# Patient Record
Sex: Male | Born: 1955 | Race: White | Hispanic: No
Health system: Midwestern US, Community
[De-identification: ages and names within clinical notes are randomized; demographics above are authoritative.]

---

## 2013-01-08 DEATH — deceased

## 2018-12-10 NOTE — Progress Notes (Signed)
Jason Whitehead was on our recall list for a repeat colonoscopy    Per [facility] patient has been deceased since 01/02/2013.    Recall cancelled.

## 2018-12-10 NOTE — Progress Notes (Signed)
Jason Whitehead was on our recall list for a repeat colonoscopy    Per [facility] patient has been deceased since 01-04-13.    Recall cancelled.

## 2020-08-18 ENCOUNTER — Encounter (HOSPITAL_COMMUNITY): Payer: Self-pay | Admitting: Emergency Medicine

## 2020-08-18 ENCOUNTER — Emergency Department (HOSPITAL_COMMUNITY): Payer: BC Managed Care – PPO

## 2020-08-18 ENCOUNTER — Other Ambulatory Visit: Payer: Self-pay

## 2020-08-18 ENCOUNTER — Inpatient Hospital Stay (HOSPITAL_COMMUNITY)
Admission: EM | Admit: 2020-08-18 | Discharge: 2020-08-30 | DRG: 233 | Disposition: A | Discharge status: Home / Self Care | Payer: BC Managed Care – PPO | Attending: Cardiothoracic Surgery | Admitting: Cardiothoracic Surgery

## 2020-08-18 DIAGNOSIS — E785 Hyperlipidemia, unspecified: Secondary | ICD-10-CM | POA: Diagnosis present

## 2020-08-18 DIAGNOSIS — Z951 Presence of aortocoronary bypass graft: Secondary | ICD-10-CM | POA: Diagnosis not present

## 2020-08-18 DIAGNOSIS — I2510 Atherosclerotic heart disease of native coronary artery without angina pectoris: Secondary | ICD-10-CM | POA: Diagnosis present

## 2020-08-18 DIAGNOSIS — Z9889 Other specified postprocedural states: Secondary | ICD-10-CM

## 2020-08-18 DIAGNOSIS — F17210 Nicotine dependence, cigarettes, uncomplicated: Secondary | ICD-10-CM | POA: Diagnosis present

## 2020-08-18 DIAGNOSIS — Z20822 Contact with and (suspected) exposure to covid-19: Secondary | ICD-10-CM | POA: Diagnosis present

## 2020-08-18 DIAGNOSIS — I5041 Acute combined systolic (congestive) and diastolic (congestive) heart failure: Secondary | ICD-10-CM | POA: Diagnosis present

## 2020-08-18 DIAGNOSIS — I2109 ST elevation (STEMI) myocardial infarction involving other coronary artery of anterior wall: Secondary | ICD-10-CM | POA: Diagnosis not present

## 2020-08-18 DIAGNOSIS — D72829 Elevated white blood cell count, unspecified: Secondary | ICD-10-CM | POA: Diagnosis present

## 2020-08-18 DIAGNOSIS — R0789 Other chest pain: Secondary | ICD-10-CM | POA: Diagnosis present

## 2020-08-18 DIAGNOSIS — Z720 Tobacco use: Secondary | ICD-10-CM

## 2020-08-18 DIAGNOSIS — I480 Paroxysmal atrial fibrillation: Secondary | ICD-10-CM | POA: Diagnosis not present

## 2020-08-18 DIAGNOSIS — D62 Acute posthemorrhagic anemia: Secondary | ICD-10-CM | POA: Diagnosis not present

## 2020-08-18 DIAGNOSIS — F17200 Nicotine dependence, unspecified, uncomplicated: Secondary | ICD-10-CM | POA: Diagnosis present

## 2020-08-18 DIAGNOSIS — Z79899 Other long term (current) drug therapy: Secondary | ICD-10-CM

## 2020-08-18 DIAGNOSIS — R7401 Elevation of levels of liver transaminase levels: Secondary | ICD-10-CM | POA: Diagnosis present

## 2020-08-18 DIAGNOSIS — N289 Disorder of kidney and ureter, unspecified: Secondary | ICD-10-CM | POA: Diagnosis not present

## 2020-08-18 DIAGNOSIS — I214 Non-ST elevation (NSTEMI) myocardial infarction: Principal | ICD-10-CM

## 2020-08-18 DIAGNOSIS — E7800 Pure hypercholesterolemia, unspecified: Secondary | ICD-10-CM | POA: Diagnosis not present

## 2020-08-18 DIAGNOSIS — Z01810 Encounter for preprocedural cardiovascular examination: Secondary | ICD-10-CM | POA: Diagnosis not present

## 2020-08-18 DIAGNOSIS — I25110 Atherosclerotic heart disease of native coronary artery with unstable angina pectoris: Secondary | ICD-10-CM | POA: Diagnosis not present

## 2020-08-18 DIAGNOSIS — Z09 Encounter for follow-up examination after completed treatment for conditions other than malignant neoplasm: Secondary | ICD-10-CM

## 2020-08-18 LAB — CBC WITH DIFFERENTIAL/PLATELET
Abs Immature Granulocytes: 0.07 10*3/uL (ref 0.00–0.07)
Basophils Absolute: 0 10*3/uL (ref 0.0–0.1)
Basophils Relative: 0 %
Eosinophils Absolute: 0 10*3/uL (ref 0.0–0.5)
Eosinophils Relative: 0 %
HCT: 48.2 % (ref 39.0–52.0)
Hemoglobin: 15.9 g/dL (ref 13.0–17.0)
Immature Granulocytes: 1 %
Lymphocytes Relative: 7 %
Lymphs Abs: 1.1 10*3/uL (ref 0.7–4.0)
MCH: 30.9 pg (ref 26.0–34.0)
MCHC: 33 g/dL (ref 30.0–36.0)
MCV: 93.6 fL (ref 80.0–100.0)
Monocytes Absolute: 1 10*3/uL (ref 0.1–1.0)
Monocytes Relative: 7 %
Neutro Abs: 13 10*3/uL — ABNORMAL HIGH (ref 1.7–7.7)
Neutrophils Relative %: 85 %
Platelets: 273 10*3/uL (ref 150–400)
RBC: 5.15 MIL/uL (ref 4.22–5.81)
RDW: 13.5 % (ref 11.5–15.5)
WBC: 15.3 10*3/uL — ABNORMAL HIGH (ref 4.0–10.5)
nRBC: 0 % (ref 0.0–0.2)

## 2020-08-18 LAB — RESP PANEL BY RT-PCR (FLU A&B, COVID) ARPGX2
Influenza A by PCR: NEGATIVE
Influenza B by PCR: NEGATIVE
SARS Coronavirus 2 by RT PCR: NEGATIVE

## 2020-08-18 LAB — TROPONIN I (HIGH SENSITIVITY)
Troponin I (High Sensitivity): >27000 ng/L (ref <18)
Troponin I (High Sensitivity): >27000 ng/L (ref <18)

## 2020-08-18 LAB — COMPREHENSIVE METABOLIC PANEL
ALT: 59 U/L — ABNORMAL HIGH (ref 0–44)
AST: 261 U/L — ABNORMAL HIGH (ref 15–41)
Albumin: 4.7 g/dL (ref 3.5–5.0)
Alkaline Phosphatase: 81 U/L (ref 38–126)
Anion gap: 9 (ref 5–15)
BUN: 21 mg/dL (ref 8–23)
CO2: 26 mmol/L (ref 22–32)
Calcium: 10.3 mg/dL (ref 8.9–10.3)
Chloride: 105 mmol/L (ref 98–111)
Creatinine, Ser: 1.15 mg/dL (ref 0.61–1.24)
GFR, Estimated: >60 mL/min (ref >60)
Glucose, Bld: 120 mg/dL — ABNORMAL HIGH (ref 70–99)
Potassium: 4.3 mmol/L (ref 3.5–5.1)
Sodium: 140 mmol/L (ref 135–145)
Total Bilirubin: 0.2 mg/dL — ABNORMAL LOW (ref 0.3–1.2)
Total Protein: 8.1 g/dL (ref 6.5–8.1)

## 2020-08-18 LAB — HEMOGLOBIN A1C
Hgb A1c MFr Bld: 5.7 % — ABNORMAL HIGH (ref 4.8–5.6)
Mean Plasma Glucose: 116.89 mg/dL

## 2020-08-18 LAB — CBG MONITORING, ED: Glucose-Capillary: 124 mg/dL — ABNORMAL HIGH (ref 70–99)

## 2020-08-18 LAB — URINALYSIS, ROUTINE W REFLEX MICROSCOPIC
Bacteria, UA: NONE SEEN
Bilirubin Urine: NEGATIVE
Glucose, UA: NEGATIVE mg/dL
Ketones, ur: NEGATIVE mg/dL
Leukocytes,Ua: NEGATIVE
Nitrite: NEGATIVE
Protein, ur: NEGATIVE mg/dL
Specific Gravity, Urine: 1.01 (ref 1.005–1.030)
pH: 5 (ref 5.0–8.0)

## 2020-08-18 LAB — LIPASE, BLOOD: Lipase: 49 U/L (ref 11–51)

## 2020-08-18 LAB — RAPID URINE DRUG SCREEN, HOSP PERFORMED
Amphetamines: NOT DETECTED
Barbiturates: NOT DETECTED
Benzodiazepines: NOT DETECTED
Cocaine: NOT DETECTED
Opiates: NOT DETECTED
Tetrahydrocannabinol: NOT DETECTED

## 2020-08-18 LAB — PROTIME-INR
INR: 0.9 (ref 0.8–1.2)
Prothrombin Time: 12.3 seconds (ref 11.4–15.2)

## 2020-08-18 LAB — APTT: aPTT: 33 seconds (ref 24–36)

## 2020-08-18 MED ORDER — HEPARIN (PORCINE) 25000 UT/250ML-% IV SOLN
1000.0000 [IU]/h | INTRAVENOUS | Status: DC
  Administered 2020-08-18: 850 [IU]/h via INTRAVENOUS
  Filled 2020-08-18: qty 250

## 2020-08-18 MED ORDER — HEPARIN BOLUS VIA INFUSION
4000.0000 [IU] | INTRAVENOUS | Status: AC
  Administered 2020-08-18: 4000 [IU] via INTRAVENOUS
  Filled 2020-08-18: qty 4000

## 2020-08-18 MED ORDER — NICOTINE 21 MG/24HR TD PT24
21.0000 mg | MEDICATED_PATCH | Freq: Every day | TRANSDERMAL | Status: DC
  Administered 2020-08-19 – 2020-08-23 (×6): 21 mg via TRANSDERMAL
  Filled 2020-08-18 (×6): qty 1

## 2020-08-18 MED ORDER — ASPIRIN 81 MG PO CHEW
324.0000 mg | CHEWABLE_TABLET | Freq: Once | ORAL | Status: AC
  Administered 2020-08-18: 324 mg via ORAL
  Filled 2020-08-18: qty 4

## 2020-08-18 NOTE — ED Notes (Signed)
Report called to [facility] 

## 2020-08-18 NOTE — ED Notes (Signed)
Lab called regarding pts troponin level, but could not provide the value. Lab states they now "can't find it and will call back."

## 2020-08-18 NOTE — ED Notes (Signed)
Attempted to call report to 3E at [facility] but no one answered.

## 2020-08-18 NOTE — ED Triage Notes (Signed)
Patient complains that he feels like he needs to burp, feels the air in his stomach and as it travels up his throat but it gets stuck in his throat. States this has been intermittent issue x2 weeks. Denies CP or SOB.

## 2020-08-18 NOTE — H&P (Signed)
Cardiology History & Physical    Patient ID: Tyler Kim MRN: [id number], DOB/AGE: [birth year]   Admit date: 08/18/2020  Primary Physician: Oneita Hurt, No Primary Cardiologist: None  Patient Profile    Tyler Kim is a 65 y.o. male with significant smoking history, but no known medical history or regular medical care who presents with worsening discomfort in the upper chest radiating to both arms.  History of Present Illness   Tyler Kim has no medical problems, takes no medications, and has never been hospitalized. Smokes 1-2 ppd since age 25. He is retired from [employer] and does typical work around the house and in the yard. He was in his normal state of health, typically walks about 4 miles a day, until about 1.5-2 weeks ago. Around this time he had an episode of fullness/pressure in his upper chest and throat like he needed to belch, occurring while he was walking and associated with severe pain in both arms. After resting and belching, the pain quickly resolved within a few minutes from onset. He had multiple similar episodes over the past couple of weeks, mostly with exertion to the point he had to limit his activity. A couple of times at rest, but resolved within a minute by belching. He presented to the [facility] ED this afternoon after he experienced the same symptoms this morning, but occurring at rest and persisting for 2 hours (9am-11am). The episodes have no other associated symptoms; no dyspnea, diaphoresis, or nausea. Prior to the last couple of weeks, he has never had any issue with reflux or other GI symptoms.   On arrival to the [facility] ED, he had been asymptomatic for several hours and VS were unremarkable. Troponin was found to be > 27000 and ECG showed evolving acute anterior infarct with associated TWI, but not meeting STEMI criteria, overall consistent with late-presenting MI on my review.  Labs otherwise notable for mildly elevated transaminases (ALT 59) and mild leukocytosis. He was  given aspirin and heparin and accepted for transfer to [facility] for further management.   Past Medical History   History reviewed. No pertinent past medical history.   History reviewed. No pertinent surgical history.   Allergies No Known Allergies  Home Medications    Prior to Admission medications   Medication Sig Start Date End Date Taking? Authorizing Provider  Ascorbic Acid (VITAMIN C) 1000 MG tablet Take 1,000 mg by mouth daily.   Yes [provider]  cholecalciferol (VITAMIN D3) 25 MCG (1000 UNIT) tablet Take 1,000 Units by mouth daily.   Yes [provider]  Omega-3 Fatty Acids (FISH OIL) 1000 MG CAPS Take 1 capsule by mouth daily.   Yes [provider]  vitamin B-12 (CYANOCOBALAMIN) 1000 MCG tablet Take 1,000 mcg by mouth daily.   Yes [provider]  vitamin E (VITAMIN E) 180 MG (400 UNITS) capsule Take 400 Units by mouth daily.   Yes [provider]    Family History    Denies any family history, specifically no cardiovascular issues and no diabetes.   Social History    Social History   Socioeconomic History  . Marital status: Married    Spouse name: Not on file  . Number of children: Not on file  . Years of education: Not on file  . Highest education level: Not on file  Occupational History  . Not on file  Tobacco Use  . Smoking status: Current Every Day Smoker    Packs/day: 2.00    Types: Cigarettes  .  Smokeless tobacco: Not on file  Substance and Sexual Activity  . Alcohol use: Not Currently  . Drug use: Not Currently  . Sexual activity: Not on file  Other Topics Concern  . Not on file  Social History Narrative  . Not on file   Social Determinants of Health   Financial Resource Strain: Not on file  Food Insecurity: Not on file  Transportation Needs: Not on file  Physical Activity: Not on file  Stress: Not on file  Social Connections: Not on file  Intimate Partner Violence: Not on file     Review of  Systems    General:  No chills, fever, night sweats or weight changes.  Cardiovascular:  See HPI Dermatological: No rash, lesions/masses Respiratory: No cough, dyspnea Urologic: No hematuria, dysuria Abdominal:   No nausea, vomiting, diarrhea, bright red blood per rectum, melena, or hematemesis Neurologic:  No visual changes, wkns, changes in mental status. All other systems reviewed and are otherwise negative except as noted above.  Physical Exam    BP (!) 133/93 (BP Location: Right Arm)   Pulse 79   Temp 98.7 F (37.1 C) (Oral)   Resp 17   Ht 5\' 10"  (1.778 m)   Wt 71.1 kg   SpO2 99%   BMI 22.48 kg/m  General: Alert, NAD HEENT: Normal  Neck: No bruits or JVD. Lungs:  Resp regular and unlabored, CTA bilaterally. Heart: Regular rhythm, no s3, s4, or murmurs. Abdomen: Soft, non-tender, non-distended, BS +.  Extremities: Warm. No clubbing, cyanosis or edema. DP/PT/Radials 2+ and equal bilaterally. Psych: Normal affect. Neuro: Alert and oriented. No gross focal deficits. No abnormal movements.  Labs    Cardiac Panel (last 3 results) Recent Labs    08/18/20 1714 08/18/20 1914  TROPONINIHS >27,000* >27,000*    Lab Results  Component Value Date   WBC 13.9 (H) 08/19/2020   HGB 13.6 08/19/2020   HCT 40.9 08/19/2020   MCV 92.3 08/19/2020   PLT 207 08/19/2020    Recent Labs  Lab 08/18/20 1714  NA 140  K 4.3  CL 105  CO2 26  BUN 21  CREATININE 1.15  CALCIUM 10.3  PROT 8.1  BILITOT 0.2*  ALKPHOS 81  ALT 59*  AST 261*  GLUCOSE 120*   No results found for: CHOL, HDL, LDLCALC, TRIG   Radiology Studies    DG Chest 2 View  Result Date: 08/18/2020 CLINICAL DATA:  Upper chest pain EXAM: CHEST - 2 VIEW COMPARISON:  None. FINDINGS: The heart size and mediastinal contours are within normal limits. Aortic atherosclerosis. Both lungs are clear. The visualized skeletal structures are unremarkable. IMPRESSION: No active cardiopulmonary disease. Electronically Signed    By: 10/18/2020 M.D.   On: 08/18/2020 17:55    ECG & Cardiac Imaging    ECG shows normal sinus rhythm with acute/subacute anterior infarct with near-complete resolution of ST-elevation, anterior/anterolateral TWI - personally reviewed.  Assessment & Plan    NSTEMI / late-presenting anterior MI:  Typical symptoms for last 2 weeks but brief episodes, today with persistent angina for 2 hours at rest. Symptoms resolved completely about 5 hours prior to arrival. Initial troponins > 27,000 and ECG consistent with late-presenting anterior MI not meeting STEMI criteria. Remains chest pain-free. Only modifiable risk factor is smoking. The patient's TIMI risk score is 3, which indicates a 13% risk of all cause mortality, new or recurrent myocardial infarction or need for urgent revascularization in the next 14 days. Killip class I, stable. -  Aspirin daily, full dose in ED - Continue heparin - Start atorvastatin 80mg  and low-dose metoprolol in the morning - Nitro SL prn - TTE and coronary angiography in the morning - Continue to monitor troponin trend, telemetry, daily ECG - Lipid panel ordered, A1c is 5.7%  Tobacco use: - Nicotine patch ordered - Ongoing smoking cessation counseling, discussed briefly overnight   Nutrition: NPO for cath DVT ppx: on heparin ggt Advanced Care Planning: Full Code   Signed, , MD 08/19/2020, 4:28 AM

## 2020-08-18 NOTE — ED Notes (Signed)
Per lab, pts initial troponin level needed to be diluted because it was really high. Lab couldn't provide a value at this time, but states the value should result in ~10 min. PA made aware.

## 2020-08-18 NOTE — Progress Notes (Signed)
ANTICOAGULATION CONSULT NOTE - Initial Consult  Pharmacy Consult for Heparin Indication: chest pain/ACS  No Known Allergies  Patient Measurements: Height: 5\' 10"  (177.8 cm) Weight: 71.7 kg (158 lb) IBW/kg (Calculated) : 73 Heparin Dosing Weight: TBW  Vital Signs: Temp: 98.2 F (36.8 C) (05/11 1609) Temp Source: Oral (05/11 1609) BP: 141/106 (05/11 1930) Pulse Rate: 91 (05/11 1930)  Labs: Recent Labs    08/18/20 1714  HGB 15.9  HCT 48.2  PLT 273  APTT 33  LABPROT 12.3  INR 0.9  CREATININE 1.15    Estimated Creatinine Clearance: 65.8 mL/min (by C-G formula based on SCr of 1.15 mg/dL).   Medical History: History reviewed. No pertinent past medical history.  Medications:  Scheduled:  Infusions:   Assessment: 21 yoM presents to ED on 5/11 with chest pain.  Pharmacy is consulted to dose Heparin IV. Baseline coags: WNL CBC: Hgb and Plt WNL SCr 1.15  Goal of Therapy:  Heparin level 0.3-0.7 units/ml Monitor platelets by anticoagulation protocol: Yes   Plan:   Give heparin 4000 units bolus IV x 1  Start heparin IV infusion at 850 units/hr  Heparin level 6 hours after starting  Daily heparin level and CBC   7/11 PharmD, BCPS Clinical Pharmacist [facility] main pharmacy [phone] 08/18/2020 8:03 PM

## 2020-08-18 NOTE — ED Notes (Signed)
[facility] called for transport. 

## 2020-08-18 NOTE — ED Provider Notes (Signed)
[facility]-EMERGENCY DEPT Provider Note   CSN: [id number] Arrival date & time: 08/18/20  1601     History No chief complaint on file.   Tyler Kim is a 65 y.o. male .  He presents today for evaluation of abnormal upper chest/throat feelings for two weeks.   Of note patient has no previously diagnosed past medical history.  He had yearly physicals for work but they consisted of physical exam only, no labs or routine screening exams have been ordered.  Patient last had this 2 years ago.  He has smoked about a pack a day since he was 31.  He states that over the past 2 weeks he has been feeling a pressure in his upper chest/throat.  He states that this feels like he needs to belch and the symptom is relieved with belching.  He notes that the symptom starts intermittently.  He has noticed that he used to walk 4 miles a day and now when he tries to walk after about 1/4 mile he feels this sensation.  It does radiate down both of his arms and is severe until he is able to belch and it goes away.  He states that today was the worst episode.  It has been becoming more and more frequent.  It is not associated with eating, he denies any diet changes. He does note that he drinks a large amount of coffee every day. He denies the symptoms radiating into his legs at all.  He denies any abdominal pain.   HPI     History reviewed. No pertinent past medical history.  Patient Active Problem List   Diagnosis Date Noted  . NSTEMI (non-ST elevated myocardial infarction) (HCC) 08/18/2020    History reviewed. No pertinent surgical history.     No family history on file.  Social History   Tobacco Use  . Smoking status: Current Every Day Smoker    Packs/day: 2.00    Types: Cigarettes  Substance Use Topics  . Alcohol use: Not Currently  . Drug use: Not Currently    Home Medications Prior to Admission medications   Medication Sig Start Date End Date Taking?  Authorizing Provider  Ascorbic Acid (VITAMIN C) 1000 MG tablet Take 1,000 mg by mouth daily.   Yes [provider]  cholecalciferol (VITAMIN D3) 25 MCG (1000 UNIT) tablet Take 1,000 Units by mouth daily.   Yes [provider]  Omega-3 Fatty Acids (FISH OIL) 1000 MG CAPS Take 1 capsule by mouth daily.   Yes [provider]  vitamin B-12 (CYANOCOBALAMIN) 1000 MCG tablet Take 1,000 mcg by mouth daily.   Yes [provider]  vitamin E (VITAMIN E) 180 MG (400 UNITS) capsule Take 400 Units by mouth daily.   Yes [provider]    Allergies    Patient has no known allergies.  Review of Systems   Review of Systems  Constitutional: Negative for chills and fever.  HENT: Negative for congestion.   Respiratory: Negative for cough and shortness of breath.   Cardiovascular: Positive for chest pain (He descirbes it as abnormal upper chest pain. ).  Gastrointestinal: Negative for abdominal pain, diarrhea, nausea and vomiting.  Musculoskeletal: Negative for back pain and neck pain.  Skin: Negative for color change.  Neurological: Negative for weakness and headaches.  Psychiatric/Behavioral: Negative for confusion.  All other systems reviewed and are negative.   Physical Exam Updated Vital Signs BP 138/90 (BP Location: Right Arm)   Pulse 84  Temp 99.1 F (37.3 C) (Oral)   Resp 16   Ht 5\' 10"  (1.778 m)   Wt 71.7 kg   SpO2 97%   BMI 22.67 kg/m   Physical Exam Vitals and nursing note reviewed.  Constitutional:      General: He is not in acute distress.    Appearance: He is not ill-appearing or diaphoretic.  HENT:     Head: Normocephalic and atraumatic.     Mouth/Throat:     Mouth: Mucous membranes are moist.     Pharynx: Oropharynx is clear. No oropharyngeal exudate or posterior oropharyngeal erythema.  Eyes:     General: No scleral icterus.       Right eye: No discharge.        Left eye: No discharge.     Conjunctiva/sclera: Conjunctivae  normal.  Cardiovascular:     Rate and Rhythm: Normal rate and regular rhythm.     Pulses: Normal pulses.     Heart sounds: Normal heart sounds.     Comments: 2+ bilateral DP/PT/radial pulses.  Pulmonary:     Effort: Pulmonary effort is normal. No respiratory distress.     Breath sounds: Normal breath sounds. No stridor.  Abdominal:     General: Abdomen is flat. There is no distension.     Tenderness: There is no abdominal tenderness. There is no guarding.  Musculoskeletal:        General: No deformity.     Cervical back: Normal range of motion and neck supple. No rigidity or tenderness.     Right lower leg: No edema.     Left lower leg: No edema.  Skin:    General: Skin is warm and dry.  Neurological:     General: No focal deficit present.     Mental Status: He is alert.     Cranial Nerves: No cranial nerve deficit.     Motor: No abnormal muscle tone.  Psychiatric:        Mood and Affect: Mood is anxious.        Behavior: Behavior normal.     ED Results / Procedures / Treatments   Labs (all labs ordered are listed, but only abnormal results are displayed) Labs Reviewed  COMPREHENSIVE METABOLIC PANEL - Abnormal; Notable for the following components:      Result Value   Glucose, Bld 120 (*)    AST 261 (*)    ALT 59 (*)    Total Bilirubin 0.2 (*)    All other components within normal limits  CBC WITH DIFFERENTIAL/PLATELET - Abnormal; Notable for the following components:   WBC 15.3 (*)    Neutro Abs 13.0 (*)    All other components within normal limits  URINALYSIS, ROUTINE W REFLEX MICROSCOPIC - Abnormal; Notable for the following components:   Hgb urine dipstick SMALL (*)    All other components within normal limits  HEMOGLOBIN A1C - Abnormal; Notable for the following components:   Hgb A1c MFr Bld 5.7 (*)    All other components within normal limits  CBG MONITORING, ED - Abnormal; Notable for the following components:   Glucose-Capillary 124 (*)    All other  components within normal limits  TROPONIN I (HIGH SENSITIVITY) - Abnormal; Notable for the following components:   Troponin I (High Sensitivity) >27,000 (*)    All other components within normal limits  TROPONIN I (HIGH SENSITIVITY) - Abnormal; Notable for the following components:   Troponin I (High Sensitivity) >27,000 (*)  All other components within normal limits  RESP PANEL BY RT-PCR (FLU A&B, COVID) ARPGX2  LIPASE, BLOOD  APTT  PROTIME-INR  RAPID URINE DRUG SCREEN, HOSP PERFORMED  HEPARIN LEVEL (UNFRACTIONATED)  CBC    EKG EKG Interpretation  Date/Time:  Wednesday Aug 18 2020 19:55:12 EDT Ventricular Rate:  95 PR Interval:  144 QRS Duration: 114 QT Interval:  352 QTC Calculation: 443 R Axis:   86 Text Interpretation: Sinus rhythm Atrial premature complex Probable anterior infarct, age indeterminate Confirmed by Margarita Grizzleay, Danielle [phone]([id number]) on 08/18/2020 8:02:06 PM   Radiology DG Chest 2 View  Result Date: 08/18/2020 CLINICAL DATA:  Upper chest pain EXAM: CHEST - 2 VIEW COMPARISON:  None. FINDINGS: The heart size and mediastinal contours are within normal limits. Aortic atherosclerosis. Both lungs are clear. The visualized skeletal structures are unremarkable. IMPRESSION: No active cardiopulmonary disease. Electronically Signed   By: Jasmine PangKim  Fujinaga M.D.   On: 08/18/2020 17:55    Procedures .Critical Care Performed by: Cristina GongHammond, Nichlas Pitera W, PA-C Authorized by: Cristina GongHammond, Nickol Collister W, PA-C   Critical care provider statement:    Critical care time (minutes):  45   Critical care time was exclusive of:  Separately billable procedures and treating other patients and teaching time   Critical care was necessary to treat or prevent imminent or life-threatening deterioration of the following conditions:  Cardiac failure   Critical care was time spent personally by me on the following activities:  Discussions with consultants, evaluation of patient's response to treatment, examination of  patient, ordering and performing treatments and interventions, ordering and review of laboratory studies, ordering and review of radiographic studies, pulse oximetry, re-evaluation of patient's condition, obtaining history from patient or surrogate and review of old charts   Care discussed with: admitting provider and accepting provider at another facility   Comments:     NSTEMI, Chest pain with EKG changes, significantly elevated troponin, requiring transfer to cardiac center.-     Medications Ordered in ED Medications  heparin ADULT infusion 100 units/mL (25000 units/25550mL) (850 Units/hr Intravenous New Bag/Given 08/18/20 2017)  aspirin chewable tablet 324 mg (324 mg Oral Given 08/18/20 1737)  heparin bolus via infusion 4,000 Units (4,000 Units Intravenous Bolus from Bag 08/18/20 2017)    ED Course  I have reviewed the triage vital signs and the nursing notes.  Pertinent labs & imaging results that were available during my care of the patient were reviewed by me and considered in my medical decision making (see chart for details).  Clinical Course as of 08/18/20 2335  Wed Aug 18, 2020  1633 Patient's EKG is concerning.  I discussed this with the patient and his wife who is now at bedside with his permission. [EH]  1949 I secure chatted patient's RN to follow-up on troponin.  I was told that lab called to give a critical however when they called they lost the result.  New lab is running.  I asked RN to follow-up on this [EH]  1952 RN clarifies that reportedly the troponin number was very elevated and they are having to dilute it to get a result. [EH]  2014 I spoke with Cardiology Rose MD who will get patient admitted to their service.  [facility] need [facility] and cath in AM anticipated.  Patient has already been heparinized and given asa.   [EH]    Clinical Course User Index [EH] Norman ClayHammond, Ormand Senn W, PA-C   MDM Rules/Calculators/A&P  Patient is a  65 year old man who does not have a primary care doctor who presents today for evaluation of abnormal feelings in his throat.  He feels like these are alleviated with burping however notes that the start with exertion and improve with rest, will radiate down both of his arms. Given that patient does not have a PCP, and is having episodes of what I suspect is chest pain radiating down arms with an exertional component I am concerned for a cardiac cause. EKG is obtained, is concerning for ischemia but not a STEMI.    He is given asa and is chest pain free at the time of EKGs and my evaluation.    Chest x-ray without evidence of active cardiopulmonary disease.  CBC shows mild leukocytosis at 15.3.  CMP shows AST and ALT are both slightly elevated.  There is no priors for comparison.  Lipase is normal.  UDS is negative, UA with small blood.  No evidence of infection.    Troponin was obtained.  Troponin is elevated over 27,000. Cardiology is consulted, they will admit patient.  Patient has already been given aspirin and heparinized.    This patient was seen as a shared visit with Dr. Rosalia Hammers.   Patient will be transferred to [place] by care link.   Note: Portions of this report may have been transcribed using voice recognition software. Every effort was made to ensure accuracy; however, inadvertent computerized transcription errors may be present   Final Clinical Impression(s) / ED Diagnoses Final diagnoses:  NSTEMI (non-ST elevated myocardial infarction) [facility])  Tobacco abuse    Rx / DC Orders ED Discharge Orders    None       Norman Clay 08/18/20 2339    Margarita Grizzle, MD 08/22/20 1600

## 2020-08-18 NOTE — ED Notes (Signed)
Attempted to call report to [facility]. Staff states primary RN is off the floor and will call back when she returns.

## 2020-08-18 NOTE — ED Notes (Signed)
[place] RN charge 3 E called with report

## 2020-08-18 NOTE — ED Notes (Signed)
ED TO INPATIENT HANDOFF REPORT  Name/Age/Gender Jamelle HaringStephen Tow 65 y.o. male  Code Status   Home/SNF/Other Home  Chief Complaint NSTEMI (non-ST elevated myocardial infarction) (HCC) [I21.4]  Level of Care/Admitting Diagnosis ED Disposition    ED Disposition Condition Comment   Admit  [facility]ospital [facility]: [facility] [[id number]]  Level of Care: Telemetry Cardiac [[id number]]  May admit patient to [facility] or [facility] if equivalent level of care is available:: No  Covid Evaluation: Asymptomatic Screening Protocol (No Symptoms)  Diagnosis: NSTEMI (non-ST elevated myocardial infarction) [facility](HCC) [[id number]][[id number]]  Admitting Physician: Christell ConstantHANDRASEKHAR, MAHESH A [[id number]]  Attending Physician: Riley LamHANDRASEKHAR, MAHESH A [[id number]]  Estimated length of stay: inpatient only procedure  Certification:: I certify this patient is being admitted for an inpatient-only procedure       Medical History History reviewed. No pertinent past medical history.  Allergies No Known Allergies  IV Location/Drains/Wounds Patient Lines/Drains/Airways Status    Active Line/Drains/Airways    Name Placement date Placement time Site Days   Peripheral IV 08/18/20 Left Antecubital 08/18/20  1731  Antecubital  less than 1   Peripheral IV 08/18/20 Anterior;Left Forearm 08/18/20  2010  Forearm  less than 1          Labs/Imaging Results for orders placed or performed during the hospital encounter of 08/18/20 (from the past 48 hour(s))  Troponin I (High Sensitivity)     Status: Abnormal   Collection Time: 08/18/20  5:14 PM  Result Value Ref Range   Troponin I (High Sensitivity) >27,000 (HH) <18 ng/L    Comment: CRITICAL RESULT CALLED TO, READ BACK BY AND VERIFIED WITH: Zhyon Antenucci,B. R @2004  ON 05.11.2022 BY COHEN,K Performed at [facility], [address] [place], [place] [address]   Comprehensive metabolic panel     Status: Abnormal   Collection Time: 08/18/20  5:14 PM  Result Value Ref  Range   Sodium 140 135 - 145 mmol/L   Potassium 4.3 3.5 - 5.1 mmol/L   Chloride 105 98 - 111 mmol/L   CO2 26 22 - 32 mmol/L   Glucose, Bld 120 (H) 70 - 99 mg/dL    Comment: Glucose reference range applies only to samples taken after fasting for at least 8 hours.   BUN 21 8 - 23 mg/dL   Creatinine, Ser 5.281.15 0.61 - 1.24 mg/dL   Calcium 41.310.3 8.9 - 24.410.3 mg/dL   Total Protein 8.1 6.5 - 8.1 g/dL   Albumin 4.7 3.5 - 5.0 g/dL   AST 010261 (H) 15 - 41 U/L   ALT 59 (H) 0 - 44 U/L   Alkaline Phosphatase 81 38 - 126 U/L   Total Bilirubin 0.2 (L) 0.3 - 1.2 mg/dL   GFR, Estimated >27>60 >25>60 mL/min    Comment: (NOTE) Calculated using the CKD-EPI Creatinine Equation (2021)    Anion gap 9 5 - 15    Comment: Performed at [facility], [address] [place], [place] [address]  Lipase, blood     Status: None   Collection Time: 08/18/20  5:14 PM  Result Value Ref Range   Lipase 49 11 - 51 U/L    Comment: Performed at [facility], [address] [place], [place] [address]  CBC with Differential/Platelet     Status: Abnormal   Collection Time: 08/18/20  5:14 PM  Result Value Ref Range   WBC 15.3 (H) 4.0 - 10.5 K/uL   RBC 5.15 4.22 - 5.81 MIL/uL   Hemoglobin 15.9  13.0 - 17.0 g/dL   HCT 75.1 02.5 - 85.2 %   MCV 93.6 80.0 - 100.0 fL   MCH 30.9 26.0 - 34.0 pg   MCHC 33.0 30.0 - 36.0 g/dL   RDW 77.8 24.2 - 35.3 %   Platelets 273 150 - 400 K/uL   nRBC 0.0 0.0 - 0.2 %   Neutrophils Relative % 85 %   Neutro Abs 13.0 (H) 1.7 - 7.7 K/uL   Lymphocytes Relative 7 %   Lymphs Abs 1.1 0.7 - 4.0 K/uL   Monocytes Relative 7 %   Monocytes Absolute 1.0 0.1 - 1.0 K/uL   Eosinophils Relative 0 %   Eosinophils Absolute 0.0 0.0 - 0.5 K/uL   Basophils Relative 0 %   Basophils Absolute 0.0 0.0 - 0.1 K/uL   Immature Granulocytes 1 %   Abs Immature Granulocytes 0.07 0.00 - 0.07 K/uL    Comment: Performed at [facility], [address] [place], [place]  [address]  APTT     Status: None   Collection Time: 08/18/20  5:14 PM  Result Value Ref Range   aPTT 33 24 - 36 seconds    Comment: Performed at [facility], [address] [place], [place] [address]  Protime-INR     Status: None   Collection Time: 08/18/20  5:14 PM  Result Value Ref Range   Prothrombin Time 12.3 11.4 - 15.2 seconds   INR 0.9 0.8 - 1.2    Comment: (NOTE) INR goal varies based on device and disease states. Performed at [facility], [address] [place], [place] [address]   CBG monitoring, ED     Status: Abnormal   Collection Time: 08/18/20  5:32 PM  Result Value Ref Range   Glucose-Capillary 124 (H) 70 - 99 mg/dL    Comment: Glucose reference range applies only to samples taken after fasting for at least 8 hours.   Comment 1 Notify RN    Comment 2 Document in Chart   Urinalysis, Routine w reflex microscopic     Status: Abnormal   Collection Time: 08/18/20  5:36 PM  Result Value Ref Range   Color, Urine YELLOW YELLOW   APPearance CLEAR CLEAR   Specific Gravity, Urine 1.010 1.005 - 1.030   pH 5.0 5.0 - 8.0   Glucose, UA NEGATIVE NEGATIVE mg/dL   Hgb urine dipstick SMALL (A) NEGATIVE   Bilirubin Urine NEGATIVE NEGATIVE   Ketones, ur NEGATIVE NEGATIVE mg/dL   Protein, ur NEGATIVE NEGATIVE mg/dL   Nitrite NEGATIVE NEGATIVE   Leukocytes,Ua NEGATIVE NEGATIVE   RBC / HPF 0-5 0 - 5 RBC/hpf   WBC, UA 0-5 0 - 5 WBC/hpf   Bacteria, UA NONE SEEN NONE SEEN   Mucus PRESENT     Comment: Performed at [facility], [address], [place], [place] [address]  Rapid urine drug screen (hospital performed)     Status: None   Collection Time: 08/18/20  5:36 PM  Result Value Ref Range   Opiates NONE DETECTED NONE DETECTED   Cocaine NONE DETECTED NONE DETECTED   Benzodiazepines NONE DETECTED NONE DETECTED   Amphetamines NONE DETECTED NONE DETECTED   Tetrahydrocannabinol NONE DETECTED NONE DETECTED   Barbiturates NONE  DETECTED NONE DETECTED    Comment: (NOTE) DRUG SCREEN FOR MEDICAL PURPOSES ONLY.  IF CONFIRMATION IS NEEDED FOR ANY PURPOSE, NOTIFY LAB WITHIN 5 DAYS.  LOWEST DETECTABLE LIMITS FOR URINE DRUG SCREEN Drug Class  Cutoff (ng/mL) Amphetamine and metabolites    1000 Barbiturate and metabolites    200 Benzodiazepine                 200 Tricyclics and metabolites     300 Opiates and metabolites        300 Cocaine and metabolites        300 THC                            50 Performed at [facility], [address] [place], [place] [address]   Resp Panel by RT-PCR (Flu A&B, Covid) Nasopharyngeal Swab     Status: None   Collection Time: 08/18/20  7:56 PM   Specimen: Nasopharyngeal Swab; Nasopharyngeal(NP) swabs in vial transport medium  Result Value Ref Range   SARS Coronavirus 2 by RT PCR NEGATIVE NEGATIVE    Comment: (NOTE) SARS-CoV-2 target nucleic acids are NOT DETECTED.  The SARS-CoV-2 RNA is generally detectable in upper respiratory specimens during the acute phase of infection. The lowest concentration of SARS-CoV-2 viral copies this assay can detect is 138 copies/mL. A negative result does not preclude SARS-Cov-2 infection and should not be used as the sole basis for treatment or other patient management decisions. A negative result may occur with  improper specimen collection/handling, submission of specimen other than nasopharyngeal swab, presence of viral mutation(s) within the areas targeted by this assay, and inadequate number of viral copies(<138 copies/mL). A negative result must be combined with clinical observations, patient history, and epidemiological information. The expected result is Negative.  Fact Sheet for Patients:  BloggerCourse.com  Fact Sheet for Healthcare Providers:  SeriousBroker.it  This test is no t yet approved or cleared by the [place] FDA and  has  been authorized for detection and/or diagnosis of SARS-CoV-2 by FDA under an Emergency Use Authorization (EUA). This EUA will remain  in effect (meaning this test can be used) for the duration of the COVID-19 declaration under Section 564(b)(1) of the Act, 21 U.S.C.section 360bbb-3(b)(1), unless the authorization is terminated  or revoked sooner.       Influenza A by PCR NEGATIVE NEGATIVE   Influenza B by PCR NEGATIVE NEGATIVE    Comment: (NOTE) The Xpert Xpress SARS-CoV-2/FLU/RSV plus assay is intended as an aid in the diagnosis of influenza from Nasopharyngeal swab specimens and should not be used as a sole basis for treatment. Nasal washings and aspirates are unacceptable for Xpert Xpress SARS-CoV-2/FLU/RSV testing.  Fact Sheet for Patients: BloggerCourse.com  Fact Sheet for Healthcare Providers: SeriousBroker.it  This test is not yet approved or cleared by the [place] FDA and has been authorized for detection and/or diagnosis of SARS-CoV-2 by FDA under an Emergency Use Authorization (EUA). This EUA will remain in effect (meaning this test can be used) for the duration of the COVID-19 declaration under Section 564(b)(1) of the Act, 21 U.S.C. section 360bbb-3(b)(1), unless the authorization is terminated or revoked.  Performed at [facility], [address] [place], [place] [address]    DG Chest 2 View  Result Date: 08/18/2020 CLINICAL DATA:  Upper chest pain EXAM: CHEST - 2 VIEW COMPARISON:  None. FINDINGS: The heart size and mediastinal contours are within normal limits. Aortic atherosclerosis. Both lungs are clear. The visualized skeletal structures are unremarkable. IMPRESSION: No active cardiopulmonary disease. Electronically Signed   By: Jasmine Pang M.D.   On: 08/18/2020 17:55    Pending Labs  Unresulted Labs (From admission, onward)          Start     Ordered   08/19/20 0500  CBC   Daily,   R      08/18/20 2013   08/19/20 0300  Heparin level (unfractionated)  Once-Timed,   STAT        08/18/20 2013   08/18/20 1737  Hemoglobin A1c  ONCE - STAT,   STAT        08/18/20 1736          Vitals/Pain Today's Vitals   08/18/20 2000 08/18/20 2015 08/18/20 2030 08/18/20 2045  BP: (!) 148/99 (!) 142/103 (!) 144/100 139/84  Pulse: 87 88 90 86  Resp: 16 15 15 18   Temp:      TempSrc:      SpO2: 97% 100% 100% 96%  Weight:      Height:      PainSc:        Isolation Precautions No active isolations  Medications Medications  heparin ADULT infusion 100 units/mL (25000 units/256mL) (850 Units/hr Intravenous New Bag/Given 08/18/20 2017)  aspirin chewable tablet 324 mg (324 mg Oral Given 08/18/20 1737)  heparin bolus via infusion 4,000 Units (4,000 Units Intravenous Bolus from Bag 08/18/20 2017)    Mobility walks

## 2020-08-18 NOTE — ED Notes (Signed)
Jeraldine Loots PA notified of >27,000 Troponin

## 2020-08-19 ENCOUNTER — Inpatient Hospital Stay (HOSPITAL_COMMUNITY): Payer: BC Managed Care – PPO

## 2020-08-19 ENCOUNTER — Inpatient Hospital Stay (HOSPITAL_COMMUNITY)
Admission: EM | Disposition: A | Discharge status: Home / Self Care | Payer: Self-pay | Source: Home / Self Care | Attending: Cardiothoracic Surgery

## 2020-08-19 ENCOUNTER — Encounter (HOSPITAL_COMMUNITY): Payer: Self-pay | Admitting: Internal Medicine

## 2020-08-19 DIAGNOSIS — F17200 Nicotine dependence, unspecified, uncomplicated: Secondary | ICD-10-CM | POA: Diagnosis present

## 2020-08-19 DIAGNOSIS — I2109 ST elevation (STEMI) myocardial infarction involving other coronary artery of anterior wall: Secondary | ICD-10-CM

## 2020-08-19 DIAGNOSIS — E7800 Pure hypercholesterolemia, unspecified: Secondary | ICD-10-CM | POA: Diagnosis not present

## 2020-08-19 DIAGNOSIS — I214 Non-ST elevation (NSTEMI) myocardial infarction: Secondary | ICD-10-CM

## 2020-08-19 DIAGNOSIS — Z720 Tobacco use: Secondary | ICD-10-CM | POA: Diagnosis not present

## 2020-08-19 DIAGNOSIS — I2510 Atherosclerotic heart disease of native coronary artery without angina pectoris: Secondary | ICD-10-CM

## 2020-08-19 HISTORY — PX: LEFT HEART CATH AND CORONARY ANGIOGRAPHY: CATH118249

## 2020-08-19 LAB — ECHOCARDIOGRAM COMPLETE
AR max vel: 3.01 cm2
AV Area VTI: 2.9 cm2
AV Area mean vel: 2.96 cm2
AV Mean grad: 4 mmHg
AV Peak grad: 6.6 mmHg
Ao pk vel: 1.28 m/s
Area-P 1/2: 5.6 cm2
Height: 70 in
S' Lateral: 2.7 cm
Weight: 2507.21 oz

## 2020-08-19 LAB — BASIC METABOLIC PANEL
Anion gap: 7 (ref 5–15)
BUN: 16 mg/dL (ref 8–23)
CO2: 25 mmol/L (ref 22–32)
Calcium: 9.3 mg/dL (ref 8.9–10.3)
Chloride: 108 mmol/L (ref 98–111)
Creatinine, Ser: 1.22 mg/dL (ref 0.61–1.24)
GFR, Estimated: >60 mL/min (ref >60)
Glucose, Bld: 101 mg/dL — ABNORMAL HIGH (ref 70–99)
Potassium: 4.1 mmol/L (ref 3.5–5.1)
Sodium: 140 mmol/L (ref 135–145)

## 2020-08-19 LAB — CBC
HCT: 40.9 % (ref 39.0–52.0)
Hemoglobin: 13.6 g/dL (ref 13.0–17.0)
MCH: 30.7 pg (ref 26.0–34.0)
MCHC: 33.3 g/dL (ref 30.0–36.0)
MCV: 92.3 fL (ref 80.0–100.0)
Platelets: 207 10*3/uL (ref 150–400)
RBC: 4.43 MIL/uL (ref 4.22–5.81)
RDW: 13.4 % (ref 11.5–15.5)
WBC: 13.9 10*3/uL — ABNORMAL HIGH (ref 4.0–10.5)
nRBC: 0 % (ref 0.0–0.2)

## 2020-08-19 LAB — LIPID PANEL
Cholesterol: 213 mg/dL — ABNORMAL HIGH (ref 0–200)
HDL: 38 mg/dL — ABNORMAL LOW (ref >40)
LDL Cholesterol: 152 mg/dL — ABNORMAL HIGH (ref 0–99)
Total CHOL/HDL Ratio: 5.6 RATIO
Triglycerides: 114 mg/dL (ref <150)
VLDL: 23 mg/dL (ref 0–40)

## 2020-08-19 LAB — TROPONIN I (HIGH SENSITIVITY): Troponin I (High Sensitivity): >27000 ng/L (ref <18)

## 2020-08-19 LAB — HIV ANTIBODY (ROUTINE TESTING W REFLEX): HIV Screen 4th Generation wRfx: NONREACTIVE

## 2020-08-19 LAB — HEPARIN LEVEL (UNFRACTIONATED): Heparin Unfractionated: 0.25 IU/mL — ABNORMAL LOW (ref 0.30–0.70)

## 2020-08-19 SURGERY — LEFT HEART CATH AND CORONARY ANGIOGRAPHY
Anesthesia: LOCAL

## 2020-08-19 MED ORDER — SODIUM CHLORIDE 0.9 % WEIGHT BASED INFUSION
3.0000 mL/kg/h | INTRAVENOUS | Status: DC
  Administered 2020-08-19: 3 mL/kg/h via INTRAVENOUS

## 2020-08-19 MED ORDER — HEPARIN (PORCINE) IN NACL 1000-0.9 UT/500ML-% IV SOLN
INTRAVENOUS | Status: DC | PRN
  Administered 2020-08-19 (×2): 500 mL

## 2020-08-19 MED ORDER — MIDAZOLAM HCL 2 MG/2ML IJ SOLN
INTRAMUSCULAR | Status: AC
  Filled 2020-08-19: qty 2

## 2020-08-19 MED ORDER — SODIUM CHLORIDE 0.9% FLUSH
3.0000 mL | Freq: Two times a day (BID) | INTRAVENOUS | Status: DC
  Administered 2020-08-19: 3 mL via INTRAVENOUS

## 2020-08-19 MED ORDER — PERFLUTREN LIPID MICROSPHERE
1.0000 mL | INTRAVENOUS | Status: AC | PRN
  Administered 2020-08-19: 4 mL via INTRAVENOUS
  Filled 2020-08-19: qty 10

## 2020-08-19 MED ORDER — VERAPAMIL HCL 2.5 MG/ML IV SOLN
INTRAVENOUS | Status: AC
  Filled 2020-08-19: qty 2

## 2020-08-19 MED ORDER — HYDRALAZINE HCL 20 MG/ML IJ SOLN: 10.0000 mg | INTRAMUSCULAR | Status: AC | PRN

## 2020-08-19 MED ORDER — ACETAMINOPHEN 325 MG PO TABS: 650.0000 mg | ORAL_TABLET | ORAL | Status: DC | PRN

## 2020-08-19 MED ORDER — FENTANYL CITRATE (PF) 100 MCG/2ML IJ SOLN
INTRAMUSCULAR | Status: DC | PRN
  Administered 2020-08-19: 25 ug via INTRAVENOUS

## 2020-08-19 MED ORDER — SODIUM CHLORIDE 0.9% FLUSH
3.0000 mL | Freq: Two times a day (BID) | INTRAVENOUS | Status: DC
  Administered 2020-08-19 – 2020-08-24 (×4): 3 mL via INTRAVENOUS

## 2020-08-19 MED ORDER — ASPIRIN EC 81 MG PO TBEC
81.0000 mg | DELAYED_RELEASE_TABLET | Freq: Every day | ORAL | Status: DC
  Administered 2020-08-19 – 2020-08-23 (×5): 81 mg via ORAL
  Filled 2020-08-19 (×5): qty 1

## 2020-08-19 MED ORDER — SODIUM CHLORIDE 0.9 % WEIGHT BASED INFUSION
1.0000 mL/kg/h | INTRAVENOUS | Status: DC
  Administered 2020-08-19: 1 mL/kg/h via INTRAVENOUS

## 2020-08-19 MED ORDER — LIDOCAINE HCL (PF) 1 % IJ SOLN
INTRAMUSCULAR | Status: AC
  Filled 2020-08-19: qty 30

## 2020-08-19 MED ORDER — HEPARIN SODIUM (PORCINE) 1000 UNIT/ML IJ SOLN
INTRAMUSCULAR | Status: DC | PRN
  Administered 2020-08-19: 3500 [IU] via INTRAVENOUS

## 2020-08-19 MED ORDER — METOPROLOL TARTRATE 12.5 MG HALF TABLET
12.5000 mg | ORAL_TABLET | Freq: Two times a day (BID) | ORAL | Status: DC
  Administered 2020-08-19 – 2020-08-23 (×10): 12.5 mg via ORAL
  Filled 2020-08-19 (×10): qty 1

## 2020-08-19 MED ORDER — LABETALOL HCL 5 MG/ML IV SOLN: 10.0000 mg | INTRAVENOUS | Status: AC | PRN

## 2020-08-19 MED ORDER — HEPARIN (PORCINE) 25000 UT/250ML-% IV SOLN
1450.0000 [IU]/h | INTRAVENOUS | Status: DC
  Administered 2020-08-19: 1000 [IU]/h via INTRAVENOUS
  Administered 2020-08-20: 1300 [IU]/h via INTRAVENOUS
  Administered 2020-08-21 – 2020-08-23 (×4): 1450 [IU]/h via INTRAVENOUS
  Filled 2020-08-19 (×6): qty 250

## 2020-08-19 MED ORDER — IOHEXOL 350 MG/ML SOLN
INTRAVENOUS | Status: DC | PRN
  Administered 2020-08-19: 40 mL via INTRA_ARTERIAL

## 2020-08-19 MED ORDER — VERAPAMIL HCL 2.5 MG/ML IV SOLN
INTRAVENOUS | Status: DC | PRN
  Administered 2020-08-19: 10 mL via INTRA_ARTERIAL

## 2020-08-19 MED ORDER — HEPARIN SODIUM (PORCINE) 1000 UNIT/ML IJ SOLN
INTRAMUSCULAR | Status: AC
  Filled 2020-08-19: qty 1

## 2020-08-19 MED ORDER — NITROGLYCERIN 0.4 MG SL SUBL: 0.4000 mg | SUBLINGUAL_TABLET | SUBLINGUAL | Status: DC | PRN

## 2020-08-19 MED ORDER — FENTANYL CITRATE (PF) 100 MCG/2ML IJ SOLN
INTRAMUSCULAR | Status: AC
  Filled 2020-08-19: qty 2

## 2020-08-19 MED ORDER — ATORVASTATIN CALCIUM 80 MG PO TABS
80.0000 mg | ORAL_TABLET | Freq: Every day | ORAL | Status: DC
  Administered 2020-08-19 – 2020-08-30 (×11): 80 mg via ORAL
  Filled 2020-08-19 (×11): qty 1

## 2020-08-19 MED ORDER — HEPARIN BOLUS VIA INFUSION
1000.0000 [IU] | INTRAVENOUS | Status: AC
  Administered 2020-08-19: 1000 [IU] via INTRAVENOUS
  Filled 2020-08-19: qty 1000

## 2020-08-19 MED ORDER — ASPIRIN 81 MG PO CHEW
81.0000 mg | CHEWABLE_TABLET | ORAL | Status: AC
  Administered 2020-08-19: 81 mg via ORAL
  Filled 2020-08-19: qty 1

## 2020-08-19 MED ORDER — SODIUM CHLORIDE 0.9% FLUSH: 3.0000 mL | INTRAVENOUS | Status: DC | PRN

## 2020-08-19 MED ORDER — HEPARIN (PORCINE) IN NACL 1000-0.9 UT/500ML-% IV SOLN
INTRAVENOUS | Status: AC
  Filled 2020-08-19: qty 1000

## 2020-08-19 MED ORDER — FUROSEMIDE 10 MG/ML IJ SOLN
20.0000 mg | Freq: Every day | INTRAMUSCULAR | Status: DC
  Administered 2020-08-19 – 2020-08-20 (×2): 20 mg via INTRAVENOUS
  Filled 2020-08-19 (×2): qty 2

## 2020-08-19 MED ORDER — SODIUM CHLORIDE 0.9 % IV SOLN: 250.0000 mL | INTRAVENOUS | Status: DC | PRN

## 2020-08-19 MED ORDER — MIDAZOLAM HCL 2 MG/2ML IJ SOLN
INTRAMUSCULAR | Status: DC | PRN
  Administered 2020-08-19: 1 mg via INTRAVENOUS

## 2020-08-19 MED ORDER — ONDANSETRON HCL 4 MG/2ML IJ SOLN: 4.0000 mg | Freq: Four times a day (QID) | INTRAMUSCULAR | Status: DC | PRN

## 2020-08-19 SURGICAL SUPPLY — 10 items
CATH 5FR JL3.5 JR4 ANG PIG MP (CATHETERS) ×1 IMPLANT
DEVICE RAD COMP TR BAND LRG (VASCULAR PRODUCTS) ×1 IMPLANT
GLIDESHEATH SLEND SS 6F .021 (SHEATH) ×1 IMPLANT
GUIDEWIRE INQWIRE 1.5J.035X260 (WIRE) IMPLANT
INQWIRE 1.5J .035X260CM (WIRE) ×2
KIT HEART LEFT (KITS) ×2 IMPLANT
PACK CARDIAC CATHETERIZATION (CUSTOM PROCEDURE TRAY) ×2 IMPLANT
SYR MEDRAD MARK 7 150ML (SYRINGE) ×2 IMPLANT
TRANSDUCER W/STOPCOCK (MISCELLANEOUS) ×2 IMPLANT
TUBING CIL FLEX 10 FLL-RA (TUBING) ×2 IMPLANT

## 2020-08-19 NOTE — Progress Notes (Signed)
TCTS consulted for CABG evaluation. °

## 2020-08-19 NOTE — Progress Notes (Signed)
ANTICOAGULATION CONSULT NOTE  Pharmacy Consult for Heparin Indication: chest pain/ACS  No Known Allergies  Patient Measurements: Height: 5\' 10"  (177.8 cm) Weight: 71.1 kg (156 lb 11.2 oz) IBW/kg (Calculated) : 73 Heparin Dosing Weight: TBW  Vital Signs: Temp: 98.3 F (36.8 C) (05/12 1132) Temp Source: Oral (05/12 1132) BP: 113/78 (05/12 1426) Pulse Rate: 0 (05/12 1431)  Labs: Recent Labs    08/18/20 1714 08/18/20 1914 08/19/20 0302 08/19/20 0401  HGB 15.9  --  13.6  --   HCT 48.2  --  40.9  --   PLT 273  --  207  --   APTT 33  --   --   --   LABPROT 12.3  --   --   --   INR 0.9  --   --   --   HEPARINUNFRC  --   --  0.25*  --   CREATININE 1.15  --   --  1.22  TROPONINIHS >27,000* >27,000*  --  >27,000*    Estimated Creatinine Clearance: 61.5 mL/min (by C-G formula based on SCr of 1.22 mg/dL).   Assessment: 16 yoM presents to ED on 5/11 with chest pain.  Pharmacy is consulted to dose Heparin IV.  Patient now s/p cath, orders to resume heparin post cath.   Goal of Therapy:  Heparin level 0.3-0.7 units/ml Monitor platelets by anticoagulation protocol: Yes   Plan:  Restart heparin at 1000 units/hr 2 hours after TR band removal.  Heparin level 6 hours after restart  7/11 PharmD., BCPS Clinical Pharmacist 08/19/2020 2:53 PM

## 2020-08-19 NOTE — Progress Notes (Signed)
ANTICOAGULATION CONSULT NOTE  Pharmacy Consult for Heparin Indication: chest pain/ACS  No Known Allergies  Patient Measurements: Height: 5\' 10"  (177.8 cm) Weight: 71.1 kg (156 lb 11.2 oz) IBW/kg (Calculated) : 73 Heparin Dosing Weight: TBW  Vital Signs: Temp: 98.8 F (37.1 C) (05/12 1545) Temp Source: Oral (05/12 1545) BP: 127/76 (05/12 1710) Pulse Rate: 75 (05/12 1710)  Labs: Recent Labs    08/18/20 1714 08/18/20 1914 08/19/20 0302 08/19/20 0401  HGB 15.9  --  13.6  --   HCT 48.2  --  40.9  --   PLT 273  --  207  --   APTT 33  --   --   --   LABPROT 12.3  --   --   --   INR 0.9  --   --   --   HEPARINUNFRC  --   --  0.25*  --   CREATININE 1.15  --   --  1.22  TROPONINIHS >27,000* >27,000*  --  >27,000*    Estimated Creatinine Clearance: 61.5 mL/min (by C-G formula based on SCr of 1.22 mg/dL).   Assessment: 87 yoM presents to ED on 5/11 with chest pain.  Pharmacy is consulted to dose Heparin IV.  Patient now s/p cath, orders to resume heparin post cath 2h after TR band removal (~1710).  Goal of Therapy:  Heparin level 0.3-0.7 units/ml Monitor platelets by anticoagulation protocol: Yes   Plan:  Restart heparin at 1000 units/hr no bolus at 1915 Check heparin level 6h after restart   7/11, PharmD, BCPS, [facility] Clinical Pharmacist [phone] Please check AMION for all [facility] Pharmacy numbers 08/19/2020

## 2020-08-19 NOTE — Progress Notes (Incomplete)
  Echocardiogram 2D Echocardiogram has been performed.  Shirlean Kelly 08/19/2020, 10:53 AM

## 2020-08-19 NOTE — Progress Notes (Signed)
ANTICOAGULATION CONSULT NOTE  Pharmacy Consult for Heparin Indication: chest pain/ACS  No Known Allergies  Patient Measurements: Height: 5\' 10"  (177.8 cm) Weight: 71.1 kg (156 lb 11.2 oz) IBW/kg (Calculated) : 73 Heparin Dosing Weight: TBW  Vital Signs: Temp: 98.7 F (37.1 C) (05/12 0305) Temp Source: Oral (05/12 0305) BP: 133/93 (05/12 0305) Pulse Rate: 79 (05/12 0305)  Labs: Recent Labs    08/18/20 1714 08/18/20 1914 08/19/20 0302  HGB 15.9  --  13.6  HCT 48.2  --  40.9  PLT 273  --  207  APTT 33  --   --   LABPROT 12.3  --   --   INR 0.9  --   --   HEPARINUNFRC  --   --  0.25*  CREATININE 1.15  --   --   TROPONINIHS >27,000* >27,000*  --     Estimated Creatinine Clearance: 65.3 mL/min (by C-G formula based on SCr of 1.15 mg/dL).   Assessment: 47 yoM presents to ED on 5/11 with chest pain.  Pharmacy is consulted to dose Heparin IV.  Heparin level subtherapeutic (0.25) on gtt at 850 units/hr. No issues with line or bleeding reported per RN.  Goal of Therapy:  Heparin level 0.3-0.7 units/ml Monitor platelets by anticoagulation protocol: Yes   Plan:  Rebolus 1000 units and increase heparin gtt to 1000 units/hr F/u 6hr heparin level  7/11, PharmD, BCPS Please see amion for complete clinical pharmacist phone list 08/19/2020 3:38 AM

## 2020-08-19 NOTE — H&P (View-Only) (Signed)
 Progress Note  Patient Name: Tyler Kim Date of Encounter: 08/19/2020  Primary Cardiologist: new  Subjective   No chest pain. Initially symptoms were indigestion.  Inpatient Medications    Scheduled Meds: . aspirin EC  81 mg Oral Daily  . atorvastatin  80 mg Oral Daily  . metoprolol tartrate  12.5 mg Oral BID  . nicotine  21 mg Transdermal Daily  . sodium chloride flush  3 mL Intravenous Q12H   Continuous Infusions: . sodium chloride    . sodium chloride 1 mL/kg/hr (08/19/20 0632)  . heparin 1,000 Units/hr (08/19/20 0345)   PRN Meds: sodium chloride, acetaminophen, nitroGLYCERIN, ondansetron (ZOFRAN) IV, sodium chloride flush   Vital Signs    Vitals:   08/19/20 0024 08/19/20 0305 08/19/20 0525 08/19/20 0745  BP: (!) 140/92 (!) 133/93  127/86  Pulse: 81 79  83  Resp: 18 17  18  Temp: 98.3 F (36.8 C) 98.7 F (37.1 C)  99 F (37.2 C)  TempSrc: Oral Oral  Oral  SpO2: 97% 99%  99%  Weight:   71.1 kg   Height:        Intake/Output Summary (Last 24 hours) at 08/19/2020 0758 Last data filed at 08/19/2020 0529 Gross per 24 hour  Intake --  Output 500 ml  Net -500 ml    I/O since admission: -500  Filed Weights   08/18/20 1612 08/19/20 0000 08/19/20 0525  Weight: 71.7 kg 71.1 kg 71.1 kg    Telemetry    Sinus at 85- Personally Reviewed  ECG    08/18/2020 ECG (independently read by me): NSR at 95, QS V2-6, I aVL  Physical Exam    BP 127/86 (BP Location: Right Arm)   Pulse 83   Temp 99 F (37.2 C) (Oral)   Resp 18   Ht 5' 10" (1.778 m)   Wt 71.1 kg   SpO2 99%   BMI 22.48 kg/m  General: Alert, oriented, no distress.  Skin: normal turgor, no rashes, warm and dry HEENT: Normocephalic, atraumatic. Pupils equal round and reactive to light; sclera anicteric; extraocular muscles intact;  Nose without nasal septal hypertrophy Mouth/Parynx benign; poor dentition Neck: No JVD, no carotid bruits; normal carotid upstroke Lungs: clear to ausculatation  and percussion; no wheezing or rales Chest wall: without tenderness to palpitation Heart: PMI not displaced, RRR, s1 s2 normal, 1/6 systolic murmur, no diastolic murmur, no rubs, gallops, thrills, or heaves Abdomen: soft, nontender; no hepatosplenomehaly, BS+; abdominal aorta nontender and not dilated by palpation. Back: no CVA tenderness Pulses 2+ Musculoskeletal: full range of motion, normal strength, no joint deformities Extremities: no clubbing cyanosis or edema, Homan's sign negative  Neurologic: grossly nonfocal; Cranial nerves grossly wnl Psychologic: Normal mood and affect   Labs    Chemistry Recent Labs  Lab 08/18/20 1714 08/19/20 0401  NA 140 140  K 4.3 4.1  CL 105 108  CO2 26 25  GLUCOSE 120* 101*  BUN 21 16  CREATININE 1.15 1.22  CALCIUM 10.3 9.3  PROT 8.1  --   ALBUMIN 4.7  --   AST 261*  --   ALT 59*  --   ALKPHOS 81  --   BILITOT 0.2*  --   GFRNONAA >60 >60  ANIONGAP 9 7     Hematology Recent Labs  Lab 08/18/20 1714 08/19/20 0302  WBC 15.3* 13.9*  RBC 5.15 4.43  HGB 15.9 13.6  HCT 48.2 40.9  MCV 93.6 92.3  MCH 30.9 30.7  MCHC 33.0   33.3  RDW 13.5 13.4  PLT 273 207    Cardiac EnzymesNo results for input(s): TROPONINI in the last 168 hours. No results for input(s): TROPIPOC in the last 168 hours.   BNPNo results for input(s): BNP, PROBNP in the last 168 hours.   DDimer No results for input(s): DDIMER in the last 168 hours.   Lipid Panel     Component Value Date/Time   CHOL 213 (H) 08/19/2020 0415   TRIG 114 08/19/2020 0415   HDL 38 (L) 08/19/2020 0415   CHOLHDL 5.6 08/19/2020 0415   VLDL 23 08/19/2020 0415   LDLCALC 152 (H) 08/19/2020 0415     Radiology    DG Chest 2 View  Result Date: 08/18/2020 CLINICAL DATA:  Upper chest pain EXAM: CHEST - 2 VIEW COMPARISON:  None. FINDINGS: The heart size and mediastinal contours are within normal limits. Aortic atherosclerosis. Both lungs are clear. The visualized skeletal structures are  unremarkable. IMPRESSION: No active cardiopulmonary disease. Electronically Signed   By: Jasmine Pang M.D.   On: 08/18/2020 17:55    Cardiac Studies   N/A  Patient Profile     65 y.o. male who was admitted yesterday after experiencing digestive symptoms commencing at 9:30 AM.  He ultimately presented to  an urgent care center and was recommended to go to [facility] long hospital for further evaluation.  At the time he was asymptomatic and ECG confirmed late presentation anterior MI  Assessment & Plan    1.  Late presentation anterior MI: The patient denied any prior chest pain.  Approximately over the past week he has noticed intermittent episodes of "indigestion."  Yesterday morning he experienced more significant indigestion which persisted for several hours.  He ultimately presented to an urgent care and was advised transfer to [facility].  The patient had been symptom-free for over 5 hours prior to presentation.  ECG reveals QS complex anteriorly with T wave inversion consistent with late presentation anterior MI.  Troponins greater than 27,000.  Last evening he was transferred from [facility] hospital to [facility] and has remained pain-free.  Suspect occluded LAD.  He presently is pain-free and denies shortness of breath.  Plan cardiac catheterization this a.m. I have reviewed the risks, indications, and alternatives to cardiac catheterization, possible angioplasty, and stenting with the patient. Risks include but are not limited to bleeding, infection, vascular injury, stroke, myocardial infection, arrhythmia, kidney injury, radiation-related injury in the case of prolonged fluoroscopy use, emergency cardiac surgery, and death. The patient understands the risks of serious complication is 1-2 in 1000 with diagnostic cardiac cath and 1-2% or less with angioplasty/stenting.  He  had been started on metoprolol 12.5 mg twice daily, depend upon LV function, most likely transition to either carvedilol  or metoprolol succinate.   Additional medications will be prescribed following his cardiac catheterization including ACE/ARB and antiplatelet therapy. Plan  echo post procedure.  2.  Longstanding tobacco use: The patient started smoking at age 18 and had been smoking 2 packs/day up until current admission.  I discussed the importance of absolute smoking cessation.  3.  Hyperlipidemia:  LDL 152. We will initiate atorvastatin 80 mg   Signed, Lennette Bihari, MD, [facility] 08/19/2020, 7:58 AM

## 2020-08-19 NOTE — Interval H&P Note (Signed)
History and Physical Interval Note:  08/19/2020 2:02 PM  Tyler Kim  has presented today for surgery, with the diagnosis of NSTEMI.  The various methods of treatment have been discussed with the patient and family. After consideration of risks, benefits and other options for treatment, the patient has consented to  Procedure(s): LEFT HEART CATH AND CORONARY ANGIOGRAPHY (N/A) as a surgical intervention.  The patient's history has been reviewed, patient examined, no change in status, stable for surgery.  I have reviewed the patient's chart and labs.  Questions were answered to the patient's satisfaction.    Cath Lab Visit (complete for each Cath Lab visit)  Clinical Evaluation Leading to the Procedure:   ACS: Yes.    Non-ACS:  N/A  [name]

## 2020-08-19 NOTE — Progress Notes (Signed)
Progress Note  Patient Name: Tyler Kim Date of Encounter: 08/19/2020  Primary Cardiologist: new  Subjective   No chest pain. Initially symptoms were indigestion.  Inpatient Medications    Scheduled Meds: . aspirin EC  81 mg Oral Daily  . atorvastatin  80 mg Oral Daily  . metoprolol tartrate  12.5 mg Oral BID  . nicotine  21 mg Transdermal Daily  . sodium chloride flush  3 mL Intravenous Q12H   Continuous Infusions: . sodium chloride    . sodium chloride 1 mL/kg/hr (08/19/20 1950)  . heparin 1,000 Units/hr (08/19/20 0345)   PRN Meds: sodium chloride, acetaminophen, nitroGLYCERIN, ondansetron (ZOFRAN) IV, sodium chloride flush   Vital Signs    Vitals:   08/19/20 0024 08/19/20 0305 08/19/20 0525 08/19/20 0745  BP: (!) 140/92 (!) 133/93  127/86  Pulse: 81 79  83  Resp: 18 17  18   Temp: 98.3 F (36.8 C) 98.7 F (37.1 C)  99 F (37.2 C)  TempSrc: Oral Oral  Oral  SpO2: 97% 99%  99%  Weight:   71.1 kg   Height:        Intake/Output Summary (Last 24 hours) at 08/19/2020 0758 Last data filed at 08/19/2020 0529 Gross per 24 hour  Intake --  Output 500 ml  Net -500 ml    I/O since admission: -500  Filed Weights   08/18/20 1612 08/19/20 0000 08/19/20 0525  Weight: 71.7 kg 71.1 kg 71.1 kg    Telemetry    Sinus at 85- Personally Reviewed  ECG    08/18/2020 ECG (independently read by me): NSR at 95, QS V2-6, I aVL  Physical Exam    BP 127/86 (BP Location: Right Arm)   Pulse 83   Temp 99 F (37.2 C) (Oral)   Resp 18   Ht 5\' 10"  (1.778 m)   Wt 71.1 kg   SpO2 99%   BMI 22.48 kg/m  General: Alert, oriented, no distress.  Skin: normal turgor, no rashes, warm and dry HEENT: Normocephalic, atraumatic. Pupils equal round and reactive to light; sclera anicteric; extraocular muscles intact;  Nose without nasal septal hypertrophy Mouth/Parynx benign; poor dentition Neck: No JVD, no carotid bruits; normal carotid upstroke Lungs: clear to ausculatation  and percussion; no wheezing or rales Chest wall: without tenderness to palpitation Heart: PMI not displaced, RRR, s1 s2 normal, 1/6 systolic murmur, no diastolic murmur, no rubs, gallops, thrills, or heaves Abdomen: soft, nontender; no hepatosplenomehaly, BS+; abdominal aorta nontender and not dilated by palpation. Back: no CVA tenderness Pulses 2+ Musculoskeletal: full range of motion, normal strength, no joint deformities Extremities: no clubbing cyanosis or edema, Homan's sign negative  Neurologic: grossly nonfocal; Cranial nerves grossly wnl Psychologic: Normal mood and affect   Labs    Chemistry Recent Labs  Lab 08/18/20 1714 08/19/20 0401  NA 140 140  K 4.3 4.1  CL 105 108  CO2 26 25  GLUCOSE 120* 101*  BUN 21 16  CREATININE 1.15 1.22  CALCIUM 10.3 9.3  PROT 8.1  --   ALBUMIN 4.7  --   AST 261*  --   ALT 59*  --   ALKPHOS 81  --   BILITOT 0.2*  --   GFRNONAA >60 >60  ANIONGAP 9 7     Hematology Recent Labs  Lab 08/18/20 1714 08/19/20 0302  WBC 15.3* 13.9*  RBC 5.15 4.43  HGB 15.9 13.6  HCT 48.2 40.9  MCV 93.6 92.3  MCH 30.9 30.7  MCHC 33.0  33.3  RDW 13.5 13.4  PLT 273 207    Cardiac EnzymesNo results for input(s): TROPONINI in the last 168 hours. No results for input(s): TROPIPOC in the last 168 hours.   BNPNo results for input(s): BNP, PROBNP in the last 168 hours.   DDimer No results for input(s): DDIMER in the last 168 hours.   Lipid Panel     Component Value Date/Time   CHOL 213 (H) 08/19/2020 0415   TRIG 114 08/19/2020 0415   HDL 38 (L) 08/19/2020 0415   CHOLHDL 5.6 08/19/2020 0415   VLDL 23 08/19/2020 0415   LDLCALC 152 (H) 08/19/2020 0415     Radiology    DG Chest 2 View  Result Date: 08/18/2020 CLINICAL DATA:  Upper chest pain EXAM: CHEST - 2 VIEW COMPARISON:  None. FINDINGS: The heart size and mediastinal contours are within normal limits. Aortic atherosclerosis. Both lungs are clear. The visualized skeletal structures are  unremarkable. IMPRESSION: No active cardiopulmonary disease. Electronically Signed   By: Jasmine Pang M.D.   On: 08/18/2020 17:55    Cardiac Studies   N/A  Patient Profile     65 y.o. male who was admitted yesterday after experiencing digestive symptoms commencing at 9:30 AM.  He ultimately presented to  an urgent care center and was recommended to go to [facility] long hospital for further evaluation.  At the time he was asymptomatic and ECG confirmed late presentation anterior MI  Assessment & Plan    1.  Late presentation anterior MI: The patient denied any prior chest pain.  Approximately over the past week he has noticed intermittent episodes of "indigestion."  Yesterday morning he experienced more significant indigestion which persisted for several hours.  He ultimately presented to an urgent care and was advised transfer to [facility].  The patient had been symptom-free for over 5 hours prior to presentation.  ECG reveals QS complex anteriorly with T wave inversion consistent with late presentation anterior MI.  Troponins greater than 27,000.  Last evening he was transferred from [facility] long hospital to [facility] and has remained pain-free.  Suspect occluded LAD.  He presently is pain-free and denies shortness of breath.  Plan cardiac catheterization this a.m. I have reviewed the risks, indications, and alternatives to cardiac catheterization, possible angioplasty, and stenting with the patient. Risks include but are not limited to bleeding, infection, vascular injury, stroke, myocardial infection, arrhythmia, kidney injury, radiation-related injury in the case of prolonged fluoroscopy use, emergency cardiac surgery, and death. The patient understands the risks of serious complication is 1-2 in 1000 with diagnostic cardiac cath and 1-2% or less with angioplasty/stenting.  He  had been started on metoprolol 12.5 mg twice daily, depend upon LV function, most likely transition to either carvedilol  or metoprolol succinate.   Additional medications will be prescribed following his cardiac catheterization including ACE/ARB and antiplatelet therapy. Plan  echo post procedure.  2.  Longstanding tobacco use: The patient started smoking at age 18 and had been smoking 2 packs/day up until current admission.  I discussed the importance of absolute smoking cessation.  3.  Hyperlipidemia:  LDL 152. We will initiate atorvastatin 80 mg   Signed, Lennette Bihari, MD, [facility] 08/19/2020, 7:58 AM

## 2020-08-20 ENCOUNTER — Encounter (HOSPITAL_COMMUNITY): Payer: Self-pay | Admitting: Internal Medicine

## 2020-08-20 ENCOUNTER — Inpatient Hospital Stay (HOSPITAL_COMMUNITY): Payer: BC Managed Care – PPO

## 2020-08-20 DIAGNOSIS — E785 Hyperlipidemia, unspecified: Secondary | ICD-10-CM

## 2020-08-20 DIAGNOSIS — I25110 Atherosclerotic heart disease of native coronary artery with unstable angina pectoris: Secondary | ICD-10-CM | POA: Diagnosis not present

## 2020-08-20 DIAGNOSIS — I214 Non-ST elevation (NSTEMI) myocardial infarction: Secondary | ICD-10-CM | POA: Diagnosis not present

## 2020-08-20 DIAGNOSIS — F17200 Nicotine dependence, unspecified, uncomplicated: Secondary | ICD-10-CM | POA: Diagnosis not present

## 2020-08-20 LAB — PULMONARY FUNCTION TEST
FEF 25-75 Pre: 0.87 L/sec
FEF2575-%Pred-Pre: 31 %
FEV1-%Pred-Pre: 63 %
FEV1-Pre: 2.21 L
FEV1FVC-%Pred-Pre: 88 %
FEV6-%Pred-Pre: 69 %
FEV6-Pre: 3.08 L
FEV6FVC-%Pred-Pre: 97 %
FVC-%Pred-Pre: 72 %
FVC-Pre: 3.34 L
Pre FEV1/FVC ratio: 66 %
Pre FEV6/FVC Ratio: 92 %

## 2020-08-20 LAB — CBC
HCT: 38.9 % — ABNORMAL LOW (ref 39.0–52.0)
Hemoglobin: 12.7 g/dL — ABNORMAL LOW (ref 13.0–17.0)
MCH: 30.5 pg (ref 26.0–34.0)
MCHC: 32.6 g/dL (ref 30.0–36.0)
MCV: 93.5 fL (ref 80.0–100.0)
Platelets: 200 10*3/uL (ref 150–400)
RBC: 4.16 MIL/uL — ABNORMAL LOW (ref 4.22–5.81)
RDW: 13.7 % (ref 11.5–15.5)
WBC: 11.8 10*3/uL — ABNORMAL HIGH (ref 4.0–10.5)
nRBC: 0 % (ref 0.0–0.2)

## 2020-08-20 LAB — HEPARIN LEVEL (UNFRACTIONATED)
Heparin Unfractionated: 0.16 IU/mL — ABNORMAL LOW (ref 0.30–0.70)
Heparin Unfractionated: 0.29 IU/mL — ABNORMAL LOW (ref 0.30–0.70)
Heparin Unfractionated: 0.27 IU/mL — ABNORMAL LOW (ref 0.30–0.70)

## 2020-08-20 MED FILL — Lidocaine HCl Local Preservative Free (PF) Inj 1%: INTRAMUSCULAR | Qty: 30 | Status: AC

## 2020-08-20 NOTE — Progress Notes (Signed)
ANTICOAGULATION CONSULT NOTE  Pharmacy Consult for Heparin Indication: chest pain/ACS  No Known Allergies  Patient Measurements: Height: 5\' 10"  (177.8 cm) Weight: 70.2 kg (154 lb 12.8 oz) IBW/kg (Calculated) : 73 Heparin Dosing Weight: TBW  Vital Signs: Temp: 97.8 F (36.6 C) (05/13 0837) Temp Source: Oral (05/13 0837) BP: 119/79 (05/13 0837) Pulse Rate: 78 (05/13 0837)  Labs: Recent Labs    08/18/20 1714 08/18/20 1914 08/19/20 0302 08/19/20 0401 08/20/20 0324 08/20/20 1058  HGB 15.9  --  13.6  --  12.7*  --   HCT 48.2  --  40.9  --  38.9*  --   PLT 273  --  207  --  200  --   APTT 33  --   --   --   --   --   LABPROT 12.3  --   --   --   --   --   INR 0.9  --   --   --   --   --   HEPARINUNFRC  --   --  0.25*  --  0.16* 0.29*  CREATININE 1.15  --   --  1.22  --   --   TROPONINIHS >27,000* >27,000*  --  >27,000*  --   --     Estimated Creatinine Clearance: 60.7 mL/min (by C-G formula based on SCr of 1.22 mg/dL).   Assessment: 68 yoM presents to ED on 5/11 with chest pain.  Pharmacy is consulted to dose Heparin IV.  Patient s/p cath 5/12, orders to resume heparin post cath. Plan for TCTS evaluation.   Heparin level subtherapeutic (0.29) on gtt at 1200 units/hr.   Goal of Therapy:  Heparin level 0.3-0.7 units/ml Monitor platelets by anticoagulation protocol: Yes   Plan:  Increase heparin to 1300 units/hr Check heparin level in 6 hours Daily HL, CBC  7/12 [facility] PharmD Student Please see amion for complete clinical pharmacist phone list 08/20/2020

## 2020-08-20 NOTE — Progress Notes (Signed)
CARDIAC REHAB PHASE I   PRE:  Rate/Rhythm: 76 SR  BP:  Supine:   Sitting: 108/89  Standing:    SaO2: 96%RA  MODE:  Ambulation: 470 ft   POST:  Rate/Rhythm: 72   BP:  Supine:   Sitting: 123/85  Standing:    SaO2: 98%RA 1420-1505 Gave pt and wife OHS booklet, care guide, and staying in the tube handout. Wrote down how to view pre op video. Pt stated not interested in watching. Gave IS and pt reached 1500 ml correctly. Discussed importance of IS and walking after surgery. Discussed staying in the tube and sternal precautions. Wife will be available to assist in care after discharge. Pt walked 470 ft on RA with steady gait. No CP.   Luetta Nutting, RN BSN  08/20/2020 3:02 PM

## 2020-08-20 NOTE — Progress Notes (Signed)
ANTICOAGULATION CONSULT NOTE  Pharmacy Consult for Heparin Indication: chest pain/ACS  No Known Allergies  Patient Measurements: Height: 5\' 10"  (177.8 cm) Weight: 70.2 kg (154 lb 12.8 oz) IBW/kg (Calculated) : 73 Heparin Dosing Weight: TBW  Vital Signs: Temp: 99 F (37.2 C) (05/13 0317) Temp Source: Oral (05/13 0317) BP: 106/71 (05/13 0317) Pulse Rate: 65 (05/13 0317)  Labs: Recent Labs    08/18/20 1714 08/18/20 1914 08/19/20 0302 08/19/20 0401 08/20/20 0324  HGB 15.9  --  13.6  --  12.7*  HCT 48.2  --  40.9  --  38.9*  PLT 273  --  207  --  200  APTT 33  --   --   --   --   LABPROT 12.3  --   --   --   --   INR 0.9  --   --   --   --   HEPARINUNFRC  --   --  0.25*  --  0.16*  CREATININE 1.15  --   --  1.22  --   TROPONINIHS >27,000* >27,000*  --  >27,000*  --     Estimated Creatinine Clearance: 60.7 mL/min (by C-G formula based on SCr of 1.22 mg/dL).   Assessment: 9 yoM presents to ED on 5/11 with chest pain.  Pharmacy is consulted to dose Heparin IV.  Patient s/p cath 5/12, orders to resume heparin post cath. Plan for TCTS evaluation.   Heparin level subtherapeutic (0.16) on gtt at 1000 units/hr. No issues with line or bleeding reported per RN.  Goal of Therapy:  Heparin level 0.3-0.7 units/ml Monitor platelets by anticoagulation protocol: Yes   Plan:  Increase heparin to 1200 units/hr Check heparin level in 6 hours  7/12, PharmD, BCPS Please see amion for complete clinical pharmacist phone list 08/20/2020

## 2020-08-20 NOTE — Progress Notes (Signed)
ANTICOAGULATION CONSULT NOTE  Pharmacy Consult for IV Heparin Indication: chest pain/ACS  No Known Allergies  Patient Measurements: Height: 5\' 10"  (177.8 cm) Weight: 70.2 kg (154 lb 12.8 oz) IBW/kg (Calculated) : 73 Heparin Dosing Weight: 70.2 kg  Labs: Recent Labs    08/18/20 1714 08/18/20 1714 08/18/20 1914 08/19/20 0302 08/19/20 0401 08/20/20 0324 08/20/20 1058 08/20/20 2025  HGB 15.9  --   --  13.6  --  12.7*  --   --   HCT 48.2  --   --  40.9  --  38.9*  --   --   PLT 273  --   --  207  --  200  --   --   APTT 33  --   --   --   --   --   --   --   LABPROT 12.3  --   --   --   --   --   --   --   INR 0.9  --   --   --   --   --   --   --   HEPARINUNFRC  --    < >  --  0.25*  --  0.16* 0.29* 0.27*  CREATININE 1.15  --   --   --  1.22  --   --   --   TROPONINIHS >27,000*  --  >27,000*  --  >27,000*  --   --   --    < > = values in this interval not displayed.    Estimated Creatinine Clearance: 60.7 mL/min (by C-G formula based on SCr of 1.22 mg/dL).   Assessment: 65 yr old man presented to the ED on 5/11 with chest pain and elevated troponins; dx'd with anterior MI.  Pharmacy was consulted to dose IV heparin.  Patient is S/P cardiac cath on 5/12, orders to resume heparin post cath. Plan for TCTS evaluation.   Heparin level ~7 hrs after heparin infusion was increased to 1300 units/hr was 0.27 units/ml, which is below the goal range for this pt. H/H 12.7/38.9, plt 200. Per RN, no issues with IV or bleeding observed.  Goal of Therapy:  Heparin level 0.3-0.7 units/ml Monitor platelets by anticoagulation protocol: Yes   Plan:  Increase heparin infusion to 1450 units/hr Check heparin level in 6 hours Monitor daily heparin level, CBC Monitor for bleeding  09-23-1999, PharmD, BCPS, [facility] Clinical Pharmacist 08/20/2020

## 2020-08-20 NOTE — Progress Notes (Signed)
Progress Note  Patient Name: Tyler Kim Date of Encounter: 08/20/2020  Primary Cardiologist: new  Subjective   No chest pain. Initially symptoms were indigestion. Feels well.  Inpatient Medications    Scheduled Meds: . aspirin EC  81 mg Oral Daily  . atorvastatin  80 mg Oral Daily  . furosemide  20 mg Intravenous Daily  . metoprolol tartrate  12.5 mg Oral BID  . nicotine  21 mg Transdermal Daily  . sodium chloride flush  3 mL Intravenous Q12H   Continuous Infusions: . sodium chloride    . heparin 1,200 Units/hr (08/20/20 0506)   PRN Meds: sodium chloride, acetaminophen, nitroGLYCERIN, ondansetron (ZOFRAN) IV, sodium chloride flush   Vital Signs    Vitals:   08/19/20 1921 08/20/20 0049 08/20/20 0317 08/20/20 0837  BP: 125/81 117/66 106/71 119/79  Pulse: 75 73 65 78  Resp: Temp: 98.3 F (36.8 C) 98.3 F (36.8 C) 99 F (37.2 C) 97.8 F (36.6 C)  TempSrc: Oral Oral Oral Oral  SpO2: 98% 95% 98% 94%  Weight:   70.2 kg   Height:        Intake/Output Summary (Last 24 hours) at 08/20/2020 1054 Last data filed at 08/20/2020 0925 Gross per 24 hour  Intake 915.97 ml  Output 975 ml  Net -59.03 ml    I/O since admission: -500  Filed Weights   08/19/20 0000 08/19/20 0525 08/20/20 0317  Weight: 71.1 kg 71.1 kg 70.2 kg    Telemetry    Sinus in the 80s- Personally Reviewed  ECG   08/19/2020 ECG (independently read by me): Probable arm lead reversal with negative T waves in lead I.  However sinus rhythm at 72.  QS V1 and V2 with significant T wave in version across the entire precordium.   08/18/2020 ECG (independently read by me): NSR at 95, QS V2-6, I aVL  Physical Exam   BP 119/79 (BP Location: Right Arm)   Pulse 78   Temp 97.8 F (36.6 C) (Oral)   Resp 20   Ht  (1.778 m)   Wt 70.2 kg   SpO2 94%   BMI 22.21 kg/m  General: Alert, oriented, no distress.  Skin: normal turgor, no rashes, warm and dry HEENT: Normocephalic, atraumatic.  Pupils equal round and reactive to light; sclera anicteric; extraocular muscles intact; Nose without nasal septal hypertrophy Mouth/Parynx benign; poor dentition Neck: No JVD, no carotid bruits; normal carotid upstroke Lungs: clear to ausculatation and percussion; no wheezing or rales Chest wall: without tenderness to palpitation Heart: PMI not displaced, RRR, s1 s2 normal, 1/6 systolic murmur, no diastolic murmur, no rubs, gallops, thrills, or heaves Abdomen: soft, nontender; no hepatosplenomehaly, BS+; abdominal aorta nontender and not dilated by palpation. Back: no CVA tenderness Pulses 2+ R radial site stable Musculoskeletal: full range of motion, normal strength, no joint deformities Extremities: no clubbing cyanosis or edema, Homan's sign negative  Neurologic: grossly nonfocal; Cranial nerves grossly wnl Psychologic: Normal mood and affect   Labs    Chemistry Recent Labs  Lab 08/18/20 1714 08/19/20 0401  NA 140 140  K 4.3 4.1  CL 105 108  CO2 26 25  GLUCOSE 120* 101*  BUN 21 16  CREATININE 1.15 1.22  CALCIUM 10.3 9.3  PROT 8.1  --   ALBUMIN 4.7  --   AST 261*  --   ALT 59*  --   ALKPHOS 81  --   BILITOT 0.2*  --   GFRNONAA >  60 >60  ANIONGAP 9 7     Hematology Recent Labs  Lab 08/18/20 1714 08/19/20 0302 08/20/20 0324  WBC 15.3* 13.9* 11.8*  RBC 5.15 4.43 4.16*  HGB 15.9 13.6 12.7*  HCT 48.2 40.9 38.9*  MCV 93.6 92.3 93.5  MCH 30.9 30.7 30.5  MCHC 33.0 33.3 32.6  RDW 13.5 13.4 13.7  PLT 273 207 200    Cardiac EnzymesNo results for input(s): TROPONINI in the last 168 hours. No results for input(s): TROPIPOC in the last 168 hours.   BNPNo results for input(s): BNP, PROBNP in the last 168 hours.   DDimer No results for input(s): DDIMER in the last 168 hours.   Lipid Panel     Component Value Date/Time   CHOL 213 (H) 08/19/2020 0415   TRIG 114 08/19/2020 0415   HDL 38 (L) 08/19/2020 0415   CHOLHDL 5.6 08/19/2020 0415   VLDL 23 08/19/2020 0415    LDLCALC 152 (H) 08/19/2020 0415     Radiology    DG Chest 2 View  Result Date: 08/18/2020 CLINICAL DATA:  Upper chest pain EXAM: CHEST - 2 VIEW COMPARISON:  None. FINDINGS: The heart size and mediastinal contours are within normal limits. Aortic atherosclerosis. Both lungs are clear. The visualized skeletal structures are unremarkable. IMPRESSION: No active cardiopulmonary disease. Electronically Signed   By: Jasmine Pang M.D.   On: 08/18/2020 17:55   CT CHEST WO CONTRAST  Result Date: 08/19/2020 CLINICAL DATA:  Thoracic aorta disease.  Pre CABG planning. EXAM: CT CHEST WITHOUT CONTRAST TECHNIQUE: Multidetector CT imaging of the chest was performed following the standard protocol without IV contrast. COMPARISON:  Chest radiographs obtained yesterday. FINDINGS: Cardiovascular: Atheromatous calcifications, including the coronary arteries and aorta. Borderline enlarged heart. Minimally enlarged ascending thoracic aorta measuring 4.1 cm in maximum diameter on image number 68/3. Minimally enlarged arch measuring 3.1 cm in diameter and normal sized descending thoracic aorta. Mediastinum/Nodes: Small hiatal hernia. Unremarkable thyroid gland. No enlarged lymph nodes. Lungs/Pleura: Mild bibasilar dependent atelectasis or scarring. Mild bilateral paraseptal bullous emphysema. Posterior left upper lobe nodule measuring 8 x 7 mm on image number 94/4 and 10 mm in length on coronal image number 67/5. Upper Abdomen: Bilateral adrenal hyperplasia. Musculoskeletal: None unremarkable bones. IMPRESSION: 1. 8 mm mean diameter left upper lobe lung nodule. Non-contrast chest CT at 6-12 months is recommended. If the nodule is stable at time of repeat CT, then future CT at 18-24 months (from today's scan) is considered optional for low-risk patients, but is recommended for high-risk patients. This recommendation follows the consensus statement: Guidelines for Management of Incidental Pulmonary Nodules Detected on CT Images:  From the Fleischner Society 2017; Radiology 2017; 284:228-243. 2. 4.1 cm ascending thoracic aortic aneurysm and minimal aneurysmal dilatation of the aortic arch with a maximum diameter of 3.1 cm. Recommend annual imaging followup by CTA or MRA. This recommendation follows 2010 ACCF/AHA/AATS/ACR/ASA/SCA/SCAI/SIR/STS/SVM Guidelines for the Diagnosis and Management of Patients with Thoracic Aortic Disease. Circulation. 2010; 121: T016-W109. Aortic aneurysm NOS (ICD10-I71.9) 3. Small hiatal hernia. 4.  Calcific coronary artery and aortic atherosclerosis. 5. Mild paraseptal bullous emphysema. Aortic Atherosclerosis (ICD10-I70.0) and Emphysema (ICD10-J43.9). Electronically Signed   By: Beckie Salts M.D.   On: 08/19/2020 18:32   CARDIAC CATHETERIZATION  Result Date: 08/19/2020 Conclusions: 1. Severe three vessel-coronary artery disease, including ulcerated 90% mid LAD stenosis, 90% mid LCx lesion, and sequential 75% mid RCA and proximal rPAV stenoses. 2. Moderately elevated left ventricular filling pressure (LVEDP 25-30 mmHg) with echo earlier today  showing LVEF 30-35%. Recommendations: 1. Cardiac surgery consultation for CABG. 2. Restart IV heparin 2 hours after TR band removal. 3. Aggressive secondary prevention of coronary artery disease; defer adding P2Y12 inhibitor pending cardiac surgery evaluation. 4. Gentle diuresis; will start furosemide 20 mg IV daily with further titration as needed. 5. Escalate goal-directed medical therapy for acute HFrEF, as blood pressure, heart rate, and renal function allow. Yvonne Kendall, MD [facility]   ECHOCARDIOGRAM COMPLETE  Result Date: 08/19/2020    ECHOCARDIOGRAM REPORT   Patient Name:   REYES ALDACO Date of Exam: 08/19/2020 Medical Rec #:  [id number]     Height:       70.0 in Accession #:    [id number]    Weight:       156.7 lb Date of Birth:  02-05-1956      BSA:          1.882 m Patient Age:    65 years      BP:           133/93 mmHg Patient Gender: M             HR:            79 bpm. Exam Location:  Inpatient Procedure: 2D Echo, Cardiac Doppler and Color Doppler Indications:    NSTEMI  History:        Patient has no prior history of Echocardiogram examinations.  Sonographer:    Shirlean Hiilani Jetter Referring Phys: [phone] SCOTT [facility] ROSE IMPRESSIONS  1. Definity contrast was used to image the LV . The apex and mid- apical anterior septal walls are akinetic. There is swirling of contrast but no evidence of apical thrombi. . Left ventricular ejection fraction, by estimation, is 30 to 35%. The left ventricle has moderately decreased function. The left ventricle demonstrates regional wall motion abnormalities (see scoring diagram/findings for description). Left ventricular diastolic parameters are consistent with Grade II diastolic dysfunction (pseudonormalization). There is severe akinesis of the left ventricular, mid-apical anteroseptal wall and apical segment.  2. Right ventricular systolic function is normal. The right ventricular size is normal.  3. The mitral valve is grossly normal. Trivial mitral valve regurgitation.  4. The aortic valve is grossly normal. Aortic valve regurgitation is not visualized. FINDINGS  Left Ventricle: Definity contrast was used to image the LV . The apex and mid- apical anterior septal walls are akinetic. There is swirling of contrast but no evidence of apical thrombi. Left ventricular ejection fraction, by estimation, is 30 to 35%. The left ventricle has moderately decreased function. The left ventricle demonstrates regional wall motion abnormalities. Severe akinesis of the left ventricular, mid-apical anteroseptal wall and apical segment. The left ventricular internal cavity size was normal in size. There is no left ventricular hypertrophy. Left ventricular diastolic parameters are consistent with Grade II diastolic dysfunction (pseudonormalization). Right Ventricle: The right ventricular size is normal. No increase in right ventricular wall thickness.  Right ventricular systolic function is normal. Left Atrium: Left atrial size was normal in size. Right Atrium: Right atrial size was normal in size. Pericardium: There is no evidence of pericardial effusion. Mitral Valve: The mitral valve is grossly normal. Trivial mitral valve regurgitation. Tricuspid Valve: The tricuspid valve is normal in structure. Tricuspid valve regurgitation is trivial. Aortic Valve: The aortic valve is grossly normal. Aortic valve regurgitation is not visualized. Aortic valve mean gradient measures 4.0 mmHg. Aortic valve peak gradient measures 6.6 mmHg. Aortic valve area, by VTI measures 2.90 cm. Pulmonic Valve: The  pulmonic valve was normal in structure. Pulmonic valve regurgitation is not visualized. Aorta: The aortic root and ascending aorta are structurally normal, with no evidence of dilitation. IAS/Shunts: The atrial septum is grossly normal.  LEFT VENTRICLE PLAX 2D LVIDd:         4.10 cm  Diastology LVIDs:         2.70 cm  LV e' medial:    7.40 cm/s LV PW:         1.40 cm  LV E/e' medial:  11.8 LV IVS:        1.50 cm  LV e' lateral:   9.68 cm/s LVOT diam:     2.00 cm  LV E/e' lateral: 9.0 LV SV:         69 LV SV Index:   36 LVOT Area:     3.14 cm  RIGHT VENTRICLE             IVC RV Basal diam:  3.00 cm     IVC diam: 1.60 cm RV S prime:     11.70 cm/s TAPSE (M-mode): 2.4 cm LEFT ATRIUM             Index       RIGHT ATRIUM           Index LA diam:        3.00 cm 1.59 cm/m  RA Area:     11.20 cm LA Vol (A2C):   61.2 ml 32.52 ml/m RA Volume:   23.60 ml  12.54 ml/m LA Vol (A4C):   55.7 ml 29.60 ml/m LA Biplane Vol: 59.6 ml 31.67 ml/m  AORTIC VALVE                   PULMONIC VALVE AV Area (Vmax):    3.01 cm    PV Vmax:       1.28 m/s AV Area (Vmean):   2.96 cm    PV Peak grad:  6.6 mmHg AV Area (VTI):     2.90 cm AV Vmax:           128.00 cm/s AV Vmean:          87.400 cm/s AV VTI:            0.237 m AV Peak Grad:      6.6 mmHg AV Mean Grad:      4.0 mmHg LVOT Vmax:          122.50 cm/s LVOT Vmean:        82.350 cm/s LVOT VTI:          0.219 m LVOT/AV VTI ratio: 0.92  AORTA Ao Root diam: 3.60 cm Ao Asc diam:  3.50 cm MITRAL VALVE MV Area (PHT): 5.60 cm    SHUNTS MV Decel Time: 136 msec    Systemic VTI:  0.22 m MV E velocity: 87.60 cm/s  Systemic Diam: 2.00 cm MV A velocity: 75.80 cm/s MV E/A ratio:  1.16 Kristeen MissPhilip Nahser MD Electronically signed by Kristeen MissPhilip Nahser MD Signature Date/Time: 08/19/2020/1:30:34 PM    Final     Cardiac Studies   CARDIAC CATH: 08/19/2020 Conclusions: 1. Severe three vessel-coronary artery disease, including ulcerated 90% mid LAD stenosis, 90% mid LCx lesion, and sequential 75% mid RCA and proximal rPAV stenoses. 2. Moderately elevated left ventricular filling pressure (LVEDP 25-30 mmHg) with echo earlier today showing LVEF 30-35%.  Recommendations: 1. Cardiac surgery consultation for CABG. 2. Restart IV heparin 2 hours after TR band removal. 3. Aggressive  secondary prevention of coronary artery disease; defer adding P2Y12 inhibitor pending cardiac surgery evaluation. 4. Gentle diuresis; will start furosemide 20 mg IV daily with further titration as needed. 5. Escalate goal-directed medical therapy for acute HFrEF, as blood pressure, heart rate, and renal function allow.  Yvonne Kendall, MD [facility]      ECHO 08/19/2020    1. Definity contrast was used to image the LV . The apex and mid- apical  anterior septal walls are akinetic. There is swirling of contrast but no  evidence of apical thrombi. . Left ventricular ejection fraction, by  estimation, is 30 to 35%. The left  ventricle has moderately decreased function. The left ventricle  demonstrates regional wall motion abnormalities (see scoring  diagram/findings for description). Left ventricular diastolic parameters  are consistent with Grade II diastolic dysfunction  (pseudonormalization). There is severe akinesis of the left ventricular,  mid-apical anteroseptal wall  and apical segment.  2. Right ventricular systolic function is normal. The right ventricular  size is normal.  3. The mitral valve is grossly normal. Trivial mitral valve  regurgitation.  4. The aortic valve is grossly normal. Aortic valve regurgitation is not  visualized.   Patient Profile     65 y.o. male who was admitted yesterday after experiencing digestive symptoms commencing at 9:30 AM.  He ultimately presented to  an urgent care center and was recommended to go to [facility] long hospital for further evaluation.  At the time he was asymptomatic and ECG confirmed late presentation anterior MI  Assessment & Plan    1.  Late presentation anterior MI: The patient denied any prior chest pain.  Over the past week he has noticed intermittent episodes of "indigestion."  Yesterday morning he experienced more significant indigestion which persisted for several hours.  He ultimately presented to an urgent care and was advised transfer to [facility].  The patient had been symptom-free for over 5 hours prior to presentation.  ECG reveals QS complex anteriorly with T wave inversion consistent with late presentation anterior MI.  Troponins greater than 27,000.  He was transferred from [facility] long hospital to [facility] and has remained pain-free.  Catheterization yesterday reveals high-grade severe multivessel CAD which CABG revascularization is recommended.  Echo Doppler study shows EF in the 30 to 35% range.  There was significant wall motion abnormality involving the mid apical anteroseptal wall and apex.    With the LAD not totally occluded, hopefully there is a significant component of "stunned" myocardium rather than irreversibly scarred.  Blood pressure today is stable but at 119/79.  We will add post MI ARB therapy initially with losartan 12.5 mg twice a day.  We will also continue low-dose metoprolol tartrate, but if LV function remains depressed we will ultimately switch to either succinate or  carvedilol.  He tells me he was seen by cardiac surgeon yesterday but was uncertain the name of the surgeon and was told of a tentative CABG revascularization date of next Tuesday, Aug 24, 2020.  2.  Longstanding tobacco use: The patient started smoking at age 35 and had been smoking 2 packs/day up until current admission.  I discussed the importance of absolute smoking cessation.  3.  Hyperlipidemia:  LDL 152.  He is now on atorvastatin 80 mg daily.  Target LDL is less than 70.   Signed, Lennette Bihari, MD, [facility] 08/20/2020, 10:54 AM

## 2020-08-21 ENCOUNTER — Encounter (HOSPITAL_COMMUNITY): Payer: Self-pay | Admitting: Internal Medicine

## 2020-08-21 DIAGNOSIS — R7401 Elevation of levels of liver transaminase levels: Secondary | ICD-10-CM | POA: Diagnosis not present

## 2020-08-21 DIAGNOSIS — I214 Non-ST elevation (NSTEMI) myocardial infarction: Secondary | ICD-10-CM | POA: Diagnosis not present

## 2020-08-21 DIAGNOSIS — I480 Paroxysmal atrial fibrillation: Secondary | ICD-10-CM | POA: Diagnosis not present

## 2020-08-21 DIAGNOSIS — I5041 Acute combined systolic (congestive) and diastolic (congestive) heart failure: Secondary | ICD-10-CM | POA: Diagnosis not present

## 2020-08-21 LAB — COMPREHENSIVE METABOLIC PANEL
ALT: 32 U/L (ref 0–44)
AST: 54 U/L — ABNORMAL HIGH (ref 15–41)
Albumin: 3.4 g/dL — ABNORMAL LOW (ref 3.5–5.0)
Alkaline Phosphatase: 63 U/L (ref 38–126)
Anion gap: 10 (ref 5–15)
BUN: 26 mg/dL — ABNORMAL HIGH (ref 8–23)
CO2: 25 mmol/L (ref 22–32)
Calcium: 8.6 mg/dL — ABNORMAL LOW (ref 8.9–10.3)
Chloride: 103 mmol/L (ref 98–111)
Creatinine, Ser: 1.31 mg/dL — ABNORMAL HIGH (ref 0.61–1.24)
GFR, Estimated: >60 mL/min (ref >60)
Glucose, Bld: 126 mg/dL — ABNORMAL HIGH (ref 70–99)
Potassium: 3.3 mmol/L — ABNORMAL LOW (ref 3.5–5.1)
Sodium: 138 mmol/L (ref 135–145)
Total Bilirubin: 1.1 mg/dL (ref 0.3–1.2)
Total Protein: 6.7 g/dL (ref 6.5–8.1)

## 2020-08-21 LAB — HEPARIN LEVEL (UNFRACTIONATED)
Heparin Unfractionated: 0.47 IU/mL (ref 0.30–0.70)
Heparin Unfractionated: 0.52 IU/mL (ref 0.30–0.70)

## 2020-08-21 LAB — CBC
HCT: 39.5 % (ref 39.0–52.0)
Hemoglobin: 13.1 g/dL (ref 13.0–17.0)
MCH: 30.6 pg (ref 26.0–34.0)
MCHC: 33.2 g/dL (ref 30.0–36.0)
MCV: 92.3 fL (ref 80.0–100.0)
Platelets: 190 10*3/uL (ref 150–400)
RBC: 4.28 MIL/uL (ref 4.22–5.81)
RDW: 13.5 % (ref 11.5–15.5)
WBC: 14.4 10*3/uL — ABNORMAL HIGH (ref 4.0–10.5)
nRBC: 0 % (ref 0.0–0.2)

## 2020-08-21 LAB — MAGNESIUM: Magnesium: 2 mg/dL (ref 1.7–2.4)

## 2020-08-21 LAB — TSH: TSH: 1.694 u[IU]/mL (ref 0.350–4.500)

## 2020-08-21 LAB — BRAIN NATRIURETIC PEPTIDE: B Natriuretic Peptide: 279.1 pg/mL — ABNORMAL HIGH (ref 0.0–100.0)

## 2020-08-21 MED ORDER — POTASSIUM CHLORIDE CRYS ER 20 MEQ PO TBCR
40.0000 meq | EXTENDED_RELEASE_TABLET | Freq: Once | ORAL | Status: AC
  Administered 2020-08-21: 40 meq via ORAL
  Filled 2020-08-21: qty 2

## 2020-08-21 NOTE — Progress Notes (Signed)
ANTICOAGULATION CONSULT NOTE  Pharmacy Consult for IV Heparin Indication: chest pain/ACS  No Known Allergies  Patient Measurements: Height: 5\' 10"  (177.8 cm) Weight: 70.2 kg (154 lb 12.8 oz) IBW/kg (Calculated) : 73 Heparin Dosing Weight: 70.2 kg  Labs: Recent Labs    08/18/20 1714 08/18/20 1714 08/18/20 1914 08/19/20 0302 08/19/20 0401 08/20/20 0324 08/20/20 1058 08/20/20 2025 08/21/20 0302  HGB 15.9  --   --  13.6  --  12.7*  --   --  13.1  HCT 48.2  --   --  40.9  --  38.9*  --   --  39.5  PLT 273  --   --  207  --  200  --   --  190  APTT 33  --   --   --   --   --   --   --   --   LABPROT 12.3  --   --   --   --   --   --   --   --   INR 0.9  --   --   --   --   --   --   --   --   HEPARINUNFRC  --    < >  --  0.25*  --  0.16* 0.29* 0.27* 0.47  CREATININE 1.15  --   --   --  1.22  --   --   --   --   TROPONINIHS >27,000*  --  >27,000*  --  >27,000*  --   --   --   --    < > = values in this interval not displayed.    Estimated Creatinine Clearance: 60.7 mL/min (by C-G formula based on SCr of 1.22 mg/dL).   Assessment: 65 yr old man presented to the ED on 5/11 with chest pain and elevated troponins; dx'd with anterior MI.  Pharmacy was consulted to dose IV heparin.  Patient is S/P cardiac cath on 5/12, orders to resume heparin post cath. Plan for TCTS evaluation.   Heparin level therapeutic (0.47) on gtt at 1450 units/hr. No bleeding noted.  Goal of Therapy:  Heparin level 0.3-0.7 units/ml Monitor platelets by anticoagulation protocol: Yes   Plan:  Continue heparin infusion at 1450 units/hr F/u confirmatory heparin level in 6 hours  7/12, PharmD, BCPS Please see amion for complete clinical pharmacist phone list 08/21/2020 4:12 AM

## 2020-08-21 NOTE — Progress Notes (Signed)
ANTICOAGULATION CONSULT NOTE  Pharmacy Consult for IV Heparin Indication: chest pain/ACS  No Known Allergies  Patient Measurements: Height: 5\' 10"  (177.8 cm) Weight: 70.7 kg (155 lb 12.8 oz) IBW/kg (Calculated) : 73 Heparin Dosing Weight: 70.2 kg  Labs: Recent Labs    08/18/20 1714 08/18/20 1714 08/18/20 1914 08/19/20 0302 08/19/20 0401 08/20/20 0324 08/20/20 1058 08/20/20 2025 08/21/20 0302 08/21/20 0900  HGB 15.9  --   --  13.6  --  12.7*  --   --  13.1  --   HCT 48.2  --   --  40.9  --  38.9*  --   --  39.5  --   PLT 273  --   --  207  --  200  --   --  190  --   APTT 33  --   --   --   --   --   --   --   --   --   LABPROT 12.3  --   --   --   --   --   --   --   --   --   INR 0.9  --   --   --   --   --   --   --   --   --   HEPARINUNFRC  --    < >  --  0.25*  --  0.16*   < > 0.27* 0.47 0.52  CREATININE 1.15  --   --   --  1.22  --   --   --   --  1.31*  TROPONINIHS >27,000*  --  >27,000*  --  >27,000*  --   --   --   --   --    < > = values in this interval not displayed.    Estimated Creatinine Clearance: 57 mL/min (A) (by C-G formula based on SCr of 1.31 mg/dL (H)).   Assessment: 65 yr old man presented to the ED on 5/11 with chest pain and elevated troponins; dx'd with anterior MI.  Pharmacy was consulted to dose IV heparin.  Patient is S/P cardiac cath on 5/12, orders to resume heparin post cath. Plan for TCTS evaluation.   Heparin level remains therapeutic (0.52) on gtt.Hgb and PLT stable. No bleeding noted.  Goal of Therapy:  Heparin level 0.3-0.7 units/ml Monitor platelets by anticoagulation protocol: Yes   Plan:  Continue heparin infusion at 1450 units/hr Daily HL, CBC Monitor for signs and symptoms of bleeding  7/12, PharmD PGY1 Pharmacy Resident 08/21/2020 10:06 AM

## 2020-08-21 NOTE — Progress Notes (Signed)
Progress Note  Patient Name: Tyler Kim Date of Encounter: 08/21/2020  Primary Cardiologist: new  Subjective   Denies any chest pain  Inpatient Medications    Scheduled Meds: . aspirin EC  81 mg Oral Daily  . atorvastatin  80 mg Oral Daily  . furosemide  20 mg Intravenous Daily  . metoprolol tartrate  12.5 mg Oral BID  . nicotine  21 mg Transdermal Daily  . sodium chloride flush  3 mL Intravenous Q12H   Continuous Infusions: . sodium chloride    . heparin 1,450 Units/hr (08/20/20 2145)   PRN Meds: sodium chloride, acetaminophen, nitroGLYCERIN, ondansetron (ZOFRAN) IV, sodium chloride flush   Vital Signs    Vitals:   08/20/20 0837 08/20/20 2132 08/21/20 0559 08/21/20 0700  BP: 119/79 118/82 112/73   Pulse: 78 74 88   Resp: Temp: 97.8 F (36.6 C) 99.4 F (37.4 C) 98.9 F (37.2 C)   TempSrc: Oral Oral Oral   SpO2: 94% 97% 98%   Weight:    70.7 kg  Height:        Intake/Output Summary (Last 24 hours) at 08/21/2020 0757 Last data filed at 08/20/2020 1819 Gross per 24 hour  Intake 720 ml  Output 225 ml  Net 495 ml    I/O since admission: -500  Filed Weights   08/19/20 0525 08/20/20 0317 08/21/20 0700  Weight: 71.1 kg 70.2 kg 70.7 kg    Telemetry    Episode of A. fib this morning with rate 120s, lasted less than 5 minutes- Personally Reviewed  ECG   08/19/2020 ECG: Probable arm lead reversal with negative T waves in lead I.  However sinus rhythm at 72.  QS V1 and V2 with significant T wave in version across the entire precordium.   08/18/2020 ECG: NSR at 95, QS V2-6, I aVL  Physical Exam   BP 112/73   Pulse 88   Temp 98.9 F (37.2 C) (Oral)   Resp 19   Ht  (1.778 m)   Wt 70.7 kg   SpO2 98%   BMI 22.35 kg/m  General: Alert, oriented, no distress.  Skin: normal turgor, no rashes, warm and dry HEENT: Normocephalic, atraumatic. Pupils equal round and reactive to light; sclera anicteric; extraocular muscles intact; Nose  without nasal septal hypertrophy Mouth/Parynx benign; poor dentition Neck: No JVD, no carotid bruits; normal carotid upstroke Lungs: clear to ausculatation and percussion; no wheezing or rales Chest wall: without tenderness to palpitation Heart: PMI not displaced, RRR, s1 s2 normal, 1/6 systolic murmur, no diastolic murmur, no rubs, gallops, thrills, or heaves Abdomen: soft, nontender; no hepatosplenomehaly, BS+; abdominal aorta nontender and not dilated by palpation. Back: no CVA tenderness Pulses 2+ R radial site stable Musculoskeletal: full range of motion, normal strength, no joint deformities Extremities: no clubbing cyanosis or edema, Homan's sign negative  Neurologic: grossly nonfocal; Cranial nerves grossly wnl Psychologic: Normal mood and affect   Labs    Chemistry Recent Labs  Lab 08/18/20 1714 08/19/20 0401  NA 140 140  K 4.3 4.1  CL 105 108  CO2 26 25  GLUCOSE 120* 101*  BUN 21 16  CREATININE 1.15 1.22  CALCIUM 10.3 9.3  PROT 8.1  --   ALBUMIN 4.7  --   AST 261*  --   ALT 59*  --   ALKPHOS 81  --   BILITOT 0.2*  --   GFRNONAA >60 >60  ANIONGAP 9 7  Hematology Recent Labs  Lab 08/19/20 0302 08/20/20 0324 08/21/20 0302  WBC 13.9* 11.8* 14.4*  RBC 4.43 4.16* 4.28  HGB 13.6 12.7* 13.1  HCT 40.9 38.9* 39.5  MCV 92.3 93.5 92.3  MCH 30.7 30.5 30.6  MCHC 33.3 32.6 33.2  RDW 13.4 13.7 13.5  PLT 207 200 190    Cardiac EnzymesNo results for input(s): TROPONINI in the last 168 hours. No results for input(s): TROPIPOC in the last 168 hours.   BNPNo results for input(s): BNP, PROBNP in the last 168 hours.   DDimer No results for input(s): DDIMER in the last 168 hours.   Lipid Panel     Component Value Date/Time   CHOL 213 (H) 08/19/2020 0415   TRIG 114 08/19/2020 0415   HDL 38 (L) 08/19/2020 0415   CHOLHDL 5.6 08/19/2020 0415   VLDL 23 08/19/2020 0415   LDLCALC 152 (H) 08/19/2020 0415     Radiology    CT CHEST WO CONTRAST  Result Date:  08/19/2020 CLINICAL DATA:  Thoracic aorta disease.  Pre CABG planning. EXAM: CT CHEST WITHOUT CONTRAST TECHNIQUE: Multidetector CT imaging of the chest was performed following the standard protocol without IV contrast. COMPARISON:  Chest radiographs obtained yesterday. FINDINGS: Cardiovascular: Atheromatous calcifications, including the coronary arteries and aorta. Borderline enlarged heart. Minimally enlarged ascending thoracic aorta measuring 4.1 cm in maximum diameter on image number 68/3. Minimally enlarged arch measuring 3.1 cm in diameter and normal sized descending thoracic aorta. Mediastinum/Nodes: Small hiatal hernia. Unremarkable thyroid gland. No enlarged lymph nodes. Lungs/Pleura: Mild bibasilar dependent atelectasis or scarring. Mild bilateral paraseptal bullous emphysema. Posterior left upper lobe nodule measuring 8 x 7 mm on image number 94/4 and 10 mm in length on coronal image number 67/5. Upper Abdomen: Bilateral adrenal hyperplasia. Musculoskeletal: None unremarkable bones. IMPRESSION: 1. 8 mm mean diameter left upper lobe lung nodule. Non-contrast chest CT at 6-12 months is recommended. If the nodule is stable at time of repeat CT, then future CT at 18-24 months (from today's scan) is considered optional for low-risk patients, but is recommended for high-risk patients. This recommendation follows the consensus statement: Guidelines for Management of Incidental Pulmonary Nodules Detected on CT Images: From the Fleischner Society 2017; Radiology 2017; 284:228-243. 2. 4.1 cm ascending thoracic aortic aneurysm and minimal aneurysmal dilatation of the aortic arch with a maximum diameter of 3.1 cm. Recommend annual imaging followup by CTA or MRA. This recommendation follows 2010 ACCF/AHA/AATS/ACR/ASA/SCA/SCAI/SIR/STS/SVM Guidelines for the Diagnosis and Management of Patients with Thoracic Aortic Disease. Circulation. 2010; 121: I297-L892. Aortic aneurysm NOS (ICD10-I71.9) 3. Small hiatal hernia. 4.   Calcific coronary artery and aortic atherosclerosis. 5. Mild paraseptal bullous emphysema. Aortic Atherosclerosis (ICD10-I70.0) and Emphysema (ICD10-J43.9). Electronically Signed   By: Beckie Salts M.D.   On: 08/19/2020 18:32   CARDIAC CATHETERIZATION  Result Date: 08/19/2020 Conclusions: 1. Severe three vessel-coronary artery disease, including ulcerated 90% mid LAD stenosis, 90% mid LCx lesion, and sequential 75% mid RCA and proximal rPAV stenoses. 2. Moderately elevated left ventricular filling pressure (LVEDP 25-30 mmHg) with echo earlier today showing LVEF 30-35%. Recommendations: 1. Cardiac surgery consultation for CABG. 2. Restart IV heparin 2 hours after TR band removal. 3. Aggressive secondary prevention of coronary artery disease; defer adding P2Y12 inhibitor pending cardiac surgery evaluation. 4. Gentle diuresis; will start furosemide 20 mg IV daily with further titration as needed. 5. Escalate goal-directed medical therapy for acute HFrEF, as blood pressure, heart rate, and renal function allow. Yvonne Kendall, MD [facility]   ECHOCARDIOGRAM  COMPLETE  Result Date: 08/19/2020    ECHOCARDIOGRAM REPORT   Patient Name:   Tyler Kim Date of Exam: 08/19/2020 Medical Rec #:  [id number]     Height:       70.0 in Accession #:    [id number]    Weight:       156.7 lb Date of Birth:  [birth year]      BSA:          1.882 m Patient Age:    65 years      BP:           133/93 mmHg Patient Gender: M             HR:           79 bpm. Exam Location:  Inpatient Procedure: 2D Echo, Cardiac Doppler and Color Doppler Indications:    NSTEMI  History:        Patient has no prior history of Echocardiogram examinations.  Sonographer:    Shirlean Kelly Referring Phys: [id number] SCOTT W ROSE IMPRESSIONS  1. Definity contrast was used to image the LV . The apex and mid- apical anterior septal walls are akinetic. There is swirling of contrast but no evidence of apical thrombi. . Left ventricular ejection fraction, by  estimation, is 30 to 35%. The left ventricle has moderately decreased function. The left ventricle demonstrates regional wall motion abnormalities (see scoring diagram/findings for description). Left ventricular diastolic parameters are consistent with Grade II diastolic dysfunction (pseudonormalization). There is severe akinesis of the left ventricular, mid-apical anteroseptal wall and apical segment.  2. Right ventricular systolic function is normal. The right ventricular size is normal.  3. The mitral valve is grossly normal. Trivial mitral valve regurgitation.  4. The aortic valve is grossly normal. Aortic valve regurgitation is not visualized. FINDINGS  Left Ventricle: Definity contrast was used to image the LV . The apex and mid- apical anterior septal walls are akinetic. There is swirling of contrast but no evidence of apical thrombi. Left ventricular ejection fraction, by estimation, is 30 to 35%. The left ventricle has moderately decreased function. The left ventricle demonstrates regional wall motion abnormalities. Severe akinesis of the left ventricular, mid-apical anteroseptal wall and apical segment. The left ventricular internal cavity size was normal in size. There is no left ventricular hypertrophy. Left ventricular diastolic parameters are consistent with Grade II diastolic dysfunction (pseudonormalization). Right Ventricle: The right ventricular size is normal. No increase in right ventricular wall thickness. Right ventricular systolic function is normal. Left Atrium: Left atrial size was normal in size. Right Atrium: Right atrial size was normal in size. Pericardium: There is no evidence of pericardial effusion. Mitral Valve: The mitral valve is grossly normal. Trivial mitral valve regurgitation. Tricuspid Valve: The tricuspid valve is normal in structure. Tricuspid valve regurgitation is trivial. Aortic Valve: The aortic valve is grossly normal. Aortic valve regurgitation is not visualized.  Aortic valve mean gradient measures 4.0 mmHg. Aortic valve peak gradient measures 6.6 mmHg. Aortic valve area, by VTI measures 2.90 cm. Pulmonic Valve: The pulmonic valve was normal in structure. Pulmonic valve regurgitation is not visualized. Aorta: The aortic root and ascending aorta are structurally normal, with no evidence of dilitation. IAS/Shunts: The atrial septum is grossly normal.  LEFT VENTRICLE PLAX 2D LVIDd:         4.10 cm  Diastology LVIDs:         2.70 cm  LV e' medial:    7.40 cm/s LV PW:  1.40 cm  LV E/e' medial:  11.8 LV IVS:        1.50 cm  LV e' lateral:   9.68 cm/s LVOT diam:     2.00 cm  LV E/e' lateral: 9.0 LV SV:         69 LV SV Index:   36 LVOT Area:     3.14 cm  RIGHT VENTRICLE             IVC RV Basal diam:  3.00 cm     IVC diam: 1.60 cm RV S prime:     11.70 cm/s TAPSE (M-mode): 2.4 cm LEFT ATRIUM             Index       RIGHT ATRIUM           Index LA diam:        3.00 cm 1.59 cm/m  RA Area:     11.20 cm LA Vol (A2C):   61.2 ml 32.52 ml/m RA Volume:   23.60 ml  12.54 ml/m LA Vol (A4C):   55.7 ml 29.60 ml/m LA Biplane Vol: 59.6 ml 31.67 ml/m  AORTIC VALVE                   PULMONIC VALVE AV Area (Vmax):    3.01 cm    PV Vmax:       1.28 m/s AV Area (Vmean):   2.96 cm    PV Peak grad:  6.6 mmHg AV Area (VTI):     2.90 cm AV Vmax:           128.00 cm/s AV Vmean:          87.400 cm/s AV VTI:            0.237 m AV Peak Grad:      6.6 mmHg AV Mean Grad:      4.0 mmHg LVOT Vmax:         122.50 cm/s LVOT Vmean:        82.350 cm/s LVOT VTI:          0.219 m LVOT/AV VTI ratio: 0.92  AORTA Ao Root diam: 3.60 cm Ao Asc diam:  3.50 cm MITRAL VALVE MV Area (PHT): 5.60 cm    SHUNTS MV Decel Time: 136 msec    Systemic VTI:  0.22 m MV E velocity: 87.60 cm/s  Systemic Diam: 2.00 cm MV A velocity: 75.80 cm/s MV E/A ratio:  1.16 Kristeen MissPhilip Nahser MD Electronically signed by Kristeen MissPhilip Nahser MD Signature Date/Time: 08/19/2020/1:30:34 PM    Final     Cardiac Studies   CARDIAC CATH:  08/19/2020 Conclusions: 1. Severe three vessel-coronary artery disease, including ulcerated 90% mid LAD stenosis, 90% mid LCx lesion, and sequential 75% mid RCA and proximal rPAV stenoses. 2. Moderately elevated left ventricular filling pressure (LVEDP 25-30 mmHg) with echo earlier today showing LVEF 30-35%.  Recommendations: 1. Cardiac surgery consultation for CABG. 2. Restart IV heparin 2 hours after TR band removal. 3. Aggressive secondary prevention of coronary artery disease; defer adding P2Y12 inhibitor pending cardiac surgery evaluation. 4. Gentle diuresis; will start furosemide 20 mg IV daily with further titration as needed. 5. Escalate goal-directed medical therapy for acute HFrEF, as blood pressure, heart rate, and renal function allow.  Yvonne Kendallhristopher End, MD [facility]      ECHO 08/19/2020    1. Definity contrast was used to image the LV . The apex and mid- apical  anterior septal walls are  akinetic. There is swirling of contrast but no  evidence of apical thrombi. . Left ventricular ejection fraction, by  estimation, is 30 to 35%. The left  ventricle has moderately decreased function. The left ventricle  demonstrates regional wall motion abnormalities (see scoring  diagram/findings for description). Left ventricular diastolic parameters  are consistent with Grade II diastolic dysfunction  (pseudonormalization). There is severe akinesis of the left ventricular,  mid-apical anteroseptal wall and apical segment.  2. Right ventricular systolic function is normal. The right ventricular  size is normal.  3. The mitral valve is grossly normal. Trivial mitral valve  regurgitation.  4. The aortic valve is grossly normal. Aortic valve regurgitation is not  visualized.   Patient Profile     65 y.o. male who was admitted yesterday after experiencing digestive symptoms commencing at 9:30 AM.  He ultimately presented to  an urgent care center and was recommended to go to  [facility] long hospital for further evaluation.  At the time he was asymptomatic and ECG confirmed late presentation anterior MI  Assessment & Plan    Late presentation anterior MI: The patient denied any prior chest pain.  Over the past week he has noticed intermittent episodes of "indigestion."  On 5/11 he experienced more significant indigestion which persisted for several hours.  He ultimately presented to an urgent care and was advised transfer to [facility].  TECG reveals QS complex anteriorly with T wave inversion consistent with late presentation anterior MI.  Troponins greater than 27,000.  He was transferred from [facility] hospital to [facility] and has remained pain-free.  Catheterization 5/12 reveals high-grade severe multivessel CAD which CABG revascularization is recommended.  Echo shows EF 30 to 35%.   -Cardiac surgery consulted for CABG, planning CABG on 5/17 -Continue aspirin 81 mg daily, IV heparin, atorvastatin 80 mg daily, metoprolol 12.5 mg twice daily  Acute combined systolic metastatic heart failure: Due to MI as above.  EF 30 to 35%.  Normal RV function and no significant valvular disease.  LVEDP 25 to on cath -Has been diuresing with IV Lasix 20 mg daily.  Appears euvolemic on exam.  Has not had BMET last few days, will check BMP/magnesium and hold off on further diuresis until labs returned -Continue metoprolol -Will add low-dose losartan if stable renal function  Paroxysmal atrial fibrillation: Short episode of A. fib 5/14 lasting less than 5 minutes.  CHA2DS2-VASc score 2 (CHF, CAD) -Continue heparin drip -Continue metoprolol  Longstanding tobacco use: The patient started smoking at age 76 and had been smoking 2 packs/day up until current admission.  I discussed the importance of absolute smoking cessation.  Hyperlipidemia:  LDL 152.  He is now on atorvastatin 80 mg daily.  Target LDL is less than 70.  Transaminitis: Mildly elevated LFTs on admission.  Will  recheck   Little Ishikawa, MD

## 2020-08-22 ENCOUNTER — Other Ambulatory Visit (HOSPITAL_COMMUNITY): Payer: BC Managed Care – PPO

## 2020-08-22 DIAGNOSIS — I5041 Acute combined systolic (congestive) and diastolic (congestive) heart failure: Secondary | ICD-10-CM | POA: Diagnosis not present

## 2020-08-22 DIAGNOSIS — I214 Non-ST elevation (NSTEMI) myocardial infarction: Secondary | ICD-10-CM | POA: Diagnosis not present

## 2020-08-22 DIAGNOSIS — I480 Paroxysmal atrial fibrillation: Secondary | ICD-10-CM | POA: Diagnosis not present

## 2020-08-22 DIAGNOSIS — Z720 Tobacco use: Secondary | ICD-10-CM | POA: Diagnosis not present

## 2020-08-22 LAB — CBC
HCT: 37.6 % — ABNORMAL LOW (ref 39.0–52.0)
Hemoglobin: 12.4 g/dL — ABNORMAL LOW (ref 13.0–17.0)
MCH: 30.9 pg (ref 26.0–34.0)
MCHC: 33 g/dL (ref 30.0–36.0)
MCV: 93.8 fL (ref 80.0–100.0)
Platelets: 170 10*3/uL (ref 150–400)
RBC: 4.01 MIL/uL — ABNORMAL LOW (ref 4.22–5.81)
RDW: 13.4 % (ref 11.5–15.5)
WBC: 9.1 10*3/uL (ref 4.0–10.5)
nRBC: 0 % (ref 0.0–0.2)

## 2020-08-22 LAB — COMPREHENSIVE METABOLIC PANEL
ALT: 34 U/L (ref 0–44)
AST: 39 U/L (ref 15–41)
Albumin: 3.1 g/dL — ABNORMAL LOW (ref 3.5–5.0)
Alkaline Phosphatase: 65 U/L (ref 38–126)
Anion gap: 8 (ref 5–15)
BUN: 25 mg/dL — ABNORMAL HIGH (ref 8–23)
CO2: 23 mmol/L (ref 22–32)
Calcium: 8.8 mg/dL — ABNORMAL LOW (ref 8.9–10.3)
Chloride: 107 mmol/L (ref 98–111)
Creatinine, Ser: 1.29 mg/dL — ABNORMAL HIGH (ref 0.61–1.24)
GFR, Estimated: >60 mL/min (ref >60)
Glucose, Bld: 95 mg/dL (ref 70–99)
Potassium: 4.4 mmol/L (ref 3.5–5.1)
Sodium: 138 mmol/L (ref 135–145)
Total Bilirubin: 0.7 mg/dL (ref 0.3–1.2)
Total Protein: 6.1 g/dL — ABNORMAL LOW (ref 6.5–8.1)

## 2020-08-22 LAB — HEPARIN LEVEL (UNFRACTIONATED): Heparin Unfractionated: 0.57 IU/mL (ref 0.30–0.70)

## 2020-08-22 MED ORDER — FUROSEMIDE 20 MG PO TABS
20.0000 mg | ORAL_TABLET | Freq: Every day | ORAL | Status: DC
  Administered 2020-08-22 – 2020-08-23 (×2): 20 mg via ORAL
  Filled 2020-08-22 (×2): qty 1

## 2020-08-22 NOTE — Progress Notes (Signed)
ANTICOAGULATION CONSULT NOTE  Pharmacy Consult for IV Heparin Indication: chest pain/ACS  No Known Allergies  Patient Measurements: Height: 5\' 10"  (177.8 cm) Weight: 69.8 kg (153 lb 14.1 oz) (B) IBW/kg (Calculated) : 73 Heparin Dosing Weight: 70.2 kg  Labs: Recent Labs    08/20/20 0324 08/20/20 1058 08/21/20 0302 08/21/20 0900 08/22/20 0257  HGB 12.7*  --  13.1  --  12.4*  HCT 38.9*  --  39.5  --  37.6*  PLT 200  --  190  --  170  HEPARINUNFRC 0.16*   < > 0.47 0.52 0.57  CREATININE  --   --   --  1.31* 1.29*   < > = values in this interval not displayed.    Estimated Creatinine Clearance: 57.1 mL/min (A) (by C-G formula based on SCr of 1.29 mg/dL (H)).   Assessment: 65 yr old man presented to the ED on 5/11 with chest pain and elevated troponins; dx'd with anterior MI.  Pharmacy was consulted to dose IV heparin.  Patient is S/P cardiac cath on 5/12, orders to resume heparin post cath. Plan for planning CABG on 5/17  Heparin level remains therapeutic (0.57) on gtt. Hgb and PLT stable. No bleeding noted.  Goal of Therapy:  Heparin level 0.3-0.7 units/ml Monitor platelets by anticoagulation protocol: Yes   Plan:  Continue heparin infusion at 1450 units/hr Daily HL, CBC Monitor for signs and symptoms of bleeding  6/17, PharmD PGY1 Pharmacy Resident 08/22/2020 7:51 AM

## 2020-08-22 NOTE — Progress Notes (Addendum)
Progress Note  Patient Name: Tyler Kim Date of Encounter: 08/22/2020  Primary Cardiologist: new  Subjective   BP 108/86.  Stable creatinine, 1.3.  Denies any chest pain or dyspnea  Inpatient Medications    Scheduled Meds: . aspirin EC  81 mg Oral Daily  . atorvastatin  80 mg Oral Daily  . metoprolol tartrate  12.5 mg Oral BID  . nicotine  21 mg Transdermal Daily  . sodium chloride flush  3 mL Intravenous Q12H   Continuous Infusions: . sodium chloride    . heparin 1,450 Units/hr (08/22/20 0311)   PRN Meds: sodium chloride, acetaminophen, nitroGLYCERIN, ondansetron (ZOFRAN) IV, sodium chloride flush   Vital Signs    Vitals:   08/21/20 0700 08/21/20 0808 08/21/20 1958 08/22/20 0309  BP:  117/74 114/79 108/86  Pulse:  (!) 55 70 67  Resp:  19 18 18   Temp:  98.4 F (36.9 C) 99.6 F (37.6 C) 97.8 F (36.6 C)  TempSrc:  Oral Oral Oral  SpO2:  97% 98% 99%  Weight: 70.7 kg   69.8 kg  Height:        Intake/Output Summary (Last 24 hours) at 08/22/2020 0722 Last data filed at 08/22/2020 0311 Gross per 24 hour  Intake 1723.37 ml  Output 300 ml  Net 1423.37 ml    I/O since admission: -500  Filed Weights   08/20/20 0317 08/21/20 0700 08/22/20 0309  Weight: 70.2 kg 70.7 kg 69.8 kg    Telemetry    Normal sinus rhythm with rate 50s to 60s, no A. fib- Personally Reviewed  ECG   08/19/2020 ECG: Probable arm lead reversal with negative T waves in lead I.  However sinus rhythm at 72.  QS V1 and V2 with significant T wave in version across the entire precordium.   08/18/2020 ECG: NSR at 95, QS V2-6, I aVL  Physical Exam   BP 108/86   Pulse 67   Temp 97.8 F (36.6 C) (Oral)   Resp 18   Ht 5\' 10"  (1.778 m)   Wt 69.8 kg Comment: B  SpO2 99%   BMI 22.08 kg/m  General: Alert, oriented, no distress.  Skin: normal turgor, no rashes, warm and dry HEENT: Normocephalic, atraumatic. Pupils equal round and reactive to light; sclera anicteric; extraocular muscles  intact; Nose without nasal septal hypertrophy Mouth/Parynx benign; poor dentition Neck: No JVD, no carotid bruits; normal carotid upstroke Lungs: clear to ausculatation and percussion; no wheezing or rales Chest wall: without tenderness to palpitation Heart: PMI not displaced, RRR, s1 s2 normal, 1/6 systolic murmur, no diastolic murmur, no rubs, gallops, thrills, or heaves Abdomen: soft, nontender; no hepatosplenomehaly, BS+; abdominal aorta nontender and not dilated by palpation. Back: no CVA tenderness Pulses 2+ R radial site stable Musculoskeletal: full range of motion, normal strength, no joint deformities Extremities: no clubbing cyanosis or edema, Homan's sign negative  Neurologic: grossly nonfocal; Cranial nerves grossly wnl Psychologic: Normal mood and affect   Labs    Chemistry Recent Labs  Lab 08/18/20 1714 08/19/20 0401 08/21/20 0900 08/22/20 0257  NA 140 140 138 138  K 4.3 4.1 3.3* 4.4  CL 105 108 103 107  CO2 26 25 25 23   GLUCOSE 120* 101* 126* 95  BUN 21 16 26* 25*  CREATININE 1.15 1.22 1.31* 1.29*  CALCIUM 10.3 9.3 8.6* 8.8*  PROT 8.1  --  6.7 6.1*  ALBUMIN 4.7  --  3.4* 3.1*  AST 261*  --  54* 39  ALT 59*  --  32 34  ALKPHOS 81  --  63 65  BILITOT 0.2*  --  1.1 0.7  GFRNONAA >60 >60 >60 >60  ANIONGAP 9 7 10 8      Hematology Recent Labs  Lab 08/20/20 0324 08/21/20 0302 08/22/20 0257  WBC 11.8* 14.4* 9.1  RBC 4.16* 4.28 4.01*  HGB 12.7* 13.1 12.4*  HCT 38.9* 39.5 37.6*  MCV 93.5 92.3 93.8  MCH 30.5 30.6 30.9  MCHC 32.6 33.2 33.0  RDW 13.7 13.5 13.4  PLT 200 190 170    Cardiac EnzymesNo results for input(s): TROPONINI in the last 168 hours. No results for input(s): TROPIPOC in the last 168 hours.   BNP Recent Labs  Lab 08/21/20 0900  BNP 279.1*     DDimer No results for input(s): DDIMER in the last 168 hours.   Lipid Panel     Component Value Date/Time   CHOL 213 (H) 08/19/2020 0415   TRIG 114 08/19/2020 0415   HDL 38 (L)  08/19/2020 0415   CHOLHDL 5.6 08/19/2020 0415   VLDL 23 08/19/2020 0415   LDLCALC 152 (H) 08/19/2020 0415     Radiology    No results found.  Cardiac Studies   CARDIAC CATH: 08/19/2020 Conclusions: 1. Severe three vessel-coronary artery disease, including ulcerated 90% mid LAD stenosis, 90% mid LCx lesion, and sequential 75% mid RCA and proximal rPAV stenoses. 2. Moderately elevated left ventricular filling pressure (LVEDP 25-30 mmHg) with echo earlier today showing LVEF 30-35%.  Recommendations: 1. Cardiac surgery consultation for CABG. 2. Restart IV heparin 2 hours after TR band removal. 3. Aggressive secondary prevention of coronary artery disease; defer adding P2Y12 inhibitor pending cardiac surgery evaluation. 4. Gentle diuresis; will start furosemide 20 mg IV daily with further titration as needed. 5. Escalate goal-directed medical therapy for acute HFrEF, as blood pressure, heart rate, and renal function allow.  10/19/2020, MD [facility]      ECHO 08/19/2020    1. Definity contrast was used to image the LV . The apex and mid- apical  anterior septal walls are akinetic. There is swirling of contrast but no  evidence of apical thrombi. . Left ventricular ejection fraction, by  estimation, is 30 to 35%. The left  ventricle has moderately decreased function. The left ventricle  demonstrates regional wall motion abnormalities (see scoring  diagram/findings for description). Left ventricular diastolic parameters  are consistent with Grade II diastolic dysfunction  (pseudonormalization). There is severe akinesis of the left ventricular,  mid-apical anteroseptal wall and apical segment.  2. Right ventricular systolic function is normal. The right ventricular  size is normal.  3. The mitral valve is grossly normal. Trivial mitral valve  regurgitation.  4. The aortic valve is grossly normal. Aortic valve regurgitation is not  visualized.   Patient Profile      65 y.o. male who was admitted yesterday after experiencing digestive symptoms commencing at 9:30 AM.  He ultimately presented to  an urgent care center and was recommended to go to [facility] long hospital for further evaluation.  At the time he was asymptomatic and ECG confirmed late presentation anterior MI  Assessment & Plan    Late presentation anterior MI: The patient denied any prior chest pain.  Over the past week he has noticed intermittent episodes of "indigestion."  On 5/11 he experienced more significant indigestion which persisted for several hours.  He ultimately presented to an urgent care and was advised transfer to [facility].  TECG reveals QS complex anteriorly  with T wave inversion consistent with late presentation anterior MI.  Troponins greater than 27,000.  He was transferred from [facility] to [facility] and has remained pain-free.  Catheterization 5/12 reveals high-grade severe multivessel CAD which CABG revascularization is recommended.  Echo shows EF 30 to 35%.   -Cardiac surgery consulted, planning CABG on 5/17 -Continue aspirin 81 mg daily, IV heparin, atorvastatin 80 mg daily, metoprolol 12.5 mg twice daily  Acute combined systolic metastatic heart failure: Due to MI as above.  EF 30 to 35%.  Normal RV function and no significant valvular disease.  LVEDP 25 to on cath -Has been diuresing with IV Lasix 20 mg daily.  Appears euvolemic on exam, switch to p.o. Lasix 20 mg daily -Continue metoprolol -Can add low-dose losartan if stable BP/renal function, will hold for now given soft pressures  Paroxysmal atrial fibrillation: Short episode of A. fib 5/14 lasting less than 5 minutes.  CHA2DS2-VASc score 2 (CHF, CAD) -Continue heparin drip -Continue metoprolol  Longstanding tobacco use: The patient started smoking at age 15 and had been smoking 2 packs/day up until current admission.  Discussed importance of smoking cessation.  Has nicotine  patch  Hyperlipidemia:  LDL 152.  He is now on atorvastatin 80 mg daily.  Target LDL is less than 70.  Transaminitis: Mildly elevated LFTs on admission.  Has resolved  Lung nodule: received call from Dr Tonia Brooms in pulmonology that CT chest today showed 36mm lung nodule.  Will need f/u with Dr Tonia Brooms on discharge.   Little Ishikawa, MD

## 2020-08-23 ENCOUNTER — Inpatient Hospital Stay (HOSPITAL_COMMUNITY): Payer: BC Managed Care – PPO

## 2020-08-23 DIAGNOSIS — Z01810 Encounter for preprocedural cardiovascular examination: Secondary | ICD-10-CM

## 2020-08-23 DIAGNOSIS — I214 Non-ST elevation (NSTEMI) myocardial infarction: Secondary | ICD-10-CM | POA: Diagnosis not present

## 2020-08-23 LAB — TYPE AND SCREEN
ABO/RH(D): O POS
Antibody Screen: NEGATIVE

## 2020-08-23 LAB — COMPREHENSIVE METABOLIC PANEL
ALT: 42 U/L (ref 0–44)
AST: 39 U/L (ref 15–41)
Albumin: 2.9 g/dL — ABNORMAL LOW (ref 3.5–5.0)
Alkaline Phosphatase: 66 U/L (ref 38–126)
Anion gap: 6 (ref 5–15)
BUN: 23 mg/dL (ref 8–23)
CO2: 22 mmol/L (ref 22–32)
Calcium: 8.6 mg/dL — ABNORMAL LOW (ref 8.9–10.3)
Chloride: 109 mmol/L (ref 98–111)
Creatinine, Ser: 1.16 mg/dL (ref 0.61–1.24)
GFR, Estimated: >60 mL/min (ref >60)
Glucose, Bld: 99 mg/dL (ref 70–99)
Potassium: 3.8 mmol/L (ref 3.5–5.1)
Sodium: 137 mmol/L (ref 135–145)
Total Bilirubin: 0.5 mg/dL (ref 0.3–1.2)
Total Protein: 6 g/dL — ABNORMAL LOW (ref 6.5–8.1)

## 2020-08-23 LAB — HEPARIN LEVEL (UNFRACTIONATED): Heparin Unfractionated: 0.61 IU/mL (ref 0.30–0.70)

## 2020-08-23 LAB — CBC
HCT: 37.9 % — ABNORMAL LOW (ref 39.0–52.0)
Hemoglobin: 12.4 g/dL — ABNORMAL LOW (ref 13.0–17.0)
MCH: 30.5 pg (ref 26.0–34.0)
MCHC: 32.7 g/dL (ref 30.0–36.0)
MCV: 93.1 fL (ref 80.0–100.0)
Platelets: 201 10*3/uL (ref 150–400)
RBC: 4.07 MIL/uL — ABNORMAL LOW (ref 4.22–5.81)
RDW: 13.2 % (ref 11.5–15.5)
WBC: 8.9 10*3/uL (ref 4.0–10.5)
nRBC: 0 % (ref 0.0–0.2)

## 2020-08-23 LAB — ABO/RH: ABO/RH(D): O POS

## 2020-08-23 MED ORDER — PLASMA-LYTE 148 IV SOLN
INTRAVENOUS | Status: DC
  Filled 2020-08-23: qty 2.5

## 2020-08-23 MED ORDER — TRANEXAMIC ACID 1000 MG/10ML IV SOLN
1.5000 mg/kg/h | INTRAVENOUS | Status: AC
  Administered 2020-08-24: 1.5 mg/kg/h via INTRAVENOUS
  Filled 2020-08-23: qty 25

## 2020-08-23 MED ORDER — CHLORHEXIDINE GLUCONATE CLOTH 2 % EX PADS
6.0000 | MEDICATED_PAD | Freq: Once | CUTANEOUS | Status: AC
  Administered 2020-08-23: 6 via TOPICAL

## 2020-08-23 MED ORDER — DEXMEDETOMIDINE HCL IN NACL 400 MCG/100ML IV SOLN
0.1000 ug/kg/h | INTRAVENOUS | Status: AC
  Administered 2020-08-24: .5 ug/kg/h via INTRAVENOUS
  Filled 2020-08-23: qty 100

## 2020-08-23 MED ORDER — CEFAZOLIN SODIUM-DEXTROSE 2-4 GM/100ML-% IV SOLN
2.0000 g | INTRAVENOUS | Status: DC
  Filled 2020-08-23: qty 100

## 2020-08-23 MED ORDER — TEMAZEPAM 15 MG PO CAPS
15.0000 mg | ORAL_CAPSULE | Freq: Once | ORAL | Status: AC | PRN
  Administered 2020-08-23: 15 mg via ORAL
  Filled 2020-08-23: qty 1

## 2020-08-23 MED ORDER — TRANEXAMIC ACID (OHS) PUMP PRIME SOLUTION
2.0000 mg/kg | INTRAVENOUS | Status: DC
  Filled 2020-08-23: qty 1.4

## 2020-08-23 MED ORDER — NITROGLYCERIN IN D5W 200-5 MCG/ML-% IV SOLN
2.0000 ug/min | INTRAVENOUS | Status: AC
  Administered 2020-08-24: 5 ug/min via INTRAVENOUS
  Filled 2020-08-23: qty 250

## 2020-08-23 MED ORDER — POTASSIUM CHLORIDE CRYS ER 20 MEQ PO TBCR
40.0000 meq | EXTENDED_RELEASE_TABLET | Freq: Once | ORAL | Status: AC
  Administered 2020-08-23: 40 meq via ORAL
  Filled 2020-08-23: qty 2

## 2020-08-23 MED ORDER — CHLORHEXIDINE GLUCONATE CLOTH 2 % EX PADS
6.0000 | MEDICATED_PAD | Freq: Once | CUTANEOUS | Status: AC
  Administered 2020-08-24: 6 via TOPICAL

## 2020-08-23 MED ORDER — VANCOMYCIN HCL 1250 MG/250ML IV SOLN
1250.0000 mg | INTRAVENOUS | Status: AC
  Administered 2020-08-24: 1250 mg via INTRAVENOUS
  Filled 2020-08-23: qty 250

## 2020-08-23 MED ORDER — PHENYLEPHRINE HCL-NACL 20-0.9 MG/250ML-% IV SOLN
30.0000 ug/min | INTRAVENOUS | Status: AC
  Administered 2020-08-24: 45 ug/min via INTRAVENOUS
  Filled 2020-08-23: qty 250

## 2020-08-23 MED ORDER — MAGNESIUM SULFATE 50 % IJ SOLN
40.0000 meq | INTRAMUSCULAR | Status: DC
  Filled 2020-08-23: qty 9.85

## 2020-08-23 MED ORDER — CEFAZOLIN SODIUM-DEXTROSE 2-4 GM/100ML-% IV SOLN
2.0000 g | INTRAVENOUS | Status: AC
  Administered 2020-08-24 (×2): 2 g via INTRAVENOUS
  Filled 2020-08-23: qty 100

## 2020-08-23 MED ORDER — METOPROLOL TARTRATE 12.5 MG HALF TABLET
12.5000 mg | ORAL_TABLET | Freq: Once | ORAL | Status: AC
  Administered 2020-08-24: 12.5 mg via ORAL
  Filled 2020-08-23: qty 1

## 2020-08-23 MED ORDER — EPINEPHRINE HCL 5 MG/250ML IV SOLN IN NS
0.0000 ug/min | INTRAVENOUS | Status: AC
  Administered 2020-08-24: 2 ug/min via INTRAVENOUS
  Filled 2020-08-23: qty 250

## 2020-08-23 MED ORDER — NOREPINEPHRINE 4 MG/250ML-% IV SOLN
0.0000 ug/min | INTRAVENOUS | Status: DC
  Filled 2020-08-23: qty 250

## 2020-08-23 MED ORDER — MILRINONE LACTATE IN DEXTROSE 20-5 MG/100ML-% IV SOLN
0.3000 ug/kg/min | INTRAVENOUS | Status: DC
  Filled 2020-08-23: qty 100

## 2020-08-23 MED ORDER — BISACODYL 5 MG PO TBEC
5.0000 mg | DELAYED_RELEASE_TABLET | Freq: Once | ORAL | Status: AC
  Administered 2020-08-23: 5 mg via ORAL
  Filled 2020-08-23: qty 1

## 2020-08-23 MED ORDER — CHLORHEXIDINE GLUCONATE 0.12 % MT SOLN
15.0000 mL | Freq: Once | OROMUCOSAL | Status: AC
  Administered 2020-08-24: 15 mL via OROMUCOSAL
  Filled 2020-08-23: qty 15

## 2020-08-23 MED ORDER — TRANEXAMIC ACID (OHS) BOLUS VIA INFUSION
15.0000 mg/kg | INTRAVENOUS | Status: AC
  Administered 2020-08-24: 1053 mg via INTRAVENOUS
  Filled 2020-08-23: qty 1053

## 2020-08-23 MED ORDER — SODIUM CHLORIDE 0.9 % IV SOLN
INTRAVENOUS | Status: DC
  Filled 2020-08-23: qty 30

## 2020-08-23 MED ORDER — POTASSIUM CHLORIDE 2 MEQ/ML IV SOLN
80.0000 meq | INTRAVENOUS | Status: DC
  Filled 2020-08-23: qty 40

## 2020-08-23 MED ORDER — INSULIN REGULAR(HUMAN) IN NACL 100-0.9 UT/100ML-% IV SOLN
INTRAVENOUS | Status: AC
  Administered 2020-08-24: .5 [IU]/h via INTRAVENOUS
  Filled 2020-08-23: qty 100

## 2020-08-23 NOTE — Progress Notes (Signed)
[id number] Checked with pt to see if any questions regarding pre op ed done last week.  Gave IS and had pt use again. 1250 ml. Will follow up after surgery and pt with transfer order. Luetta Nutting RN BSN 08/23/2020 1:49 PM

## 2020-08-23 NOTE — Progress Notes (Signed)
Pre-CABG study completed.  ° °Please see CV Proc for preliminary results.  ° °Micayla Brathwaite, RDMS, RVT ° °

## 2020-08-23 NOTE — Progress Notes (Signed)
ANTICOAGULATION CONSULT NOTE  Pharmacy Consult for IV Heparin Indication: chest pain/ACS  No Known Allergies  Patient Measurements: Height: 5\' 10"  (177.8 cm) Weight: 70.2 kg (154 lb 12.8 oz) IBW/kg (Calculated) : 73 Heparin Dosing Weight: 70.2 kg  Labs: Recent Labs    08/21/20 0302 08/21/20 0900 08/22/20 0257 08/23/20 0621  HGB 13.1  --  12.4* 12.4*  HCT 39.5  --  37.6* 37.9*  PLT 190  --  170 201  HEPARINUNFRC 0.47 0.52 0.57 0.61  CREATININE  --  1.31* 1.29* 1.16    Estimated Creatinine Clearance: 63.9 mL/min (by C-G formula based on SCr of 1.16 mg/dL).   Assessment: 65 yr old man presented to the ED on 5/11 with chest pain and elevated troponins; dx'd with anterior MI.  Pharmacy was consulted to dose IV heparin.  Patient is S/P cardiac cath on 5/12, orders to resume heparin post cath. Plan for planning CABG on 64/17  Heparin level remains therapeutic (0.61) on gtt. Hgb and PLT stable. No bleeding noted.  Goal of Therapy:  Heparin level 0.3-0.7 units/ml Monitor platelets by anticoagulation protocol: Yes   Plan:  Continue heparin infusion at 1450 units/hr Daily heparin level, CBC Monitor for signs and symptoms of bleeding Planning for CABG tomorrow.  64/17, Reece Leader, BCCP Clinical Pharmacist  08/23/2020 9:35 AM   [facility] pharmacy phone numbers are listed on [email or web address]

## 2020-08-23 NOTE — Progress Notes (Addendum)
Progress Note  Patient Name: Tyler Kim Date of Encounter: 08/23/2020  Primary Cardiologist: New to [facility]   Subjective   No chest pain, awaiting CABG in AM.   Inpatient Medications    Scheduled Meds: . aspirin EC  81 mg Oral Daily  . atorvastatin  80 mg Oral Daily  . furosemide  20 mg Oral Daily  . metoprolol tartrate  12.5 mg Oral BID  . nicotine  21 mg Transdermal Daily  . sodium chloride flush  3 mL Intravenous Q12H   Continuous Infusions: . sodium chloride    . heparin 1,450 Units/hr (08/22/20 2037)   PRN Meds: sodium chloride, acetaminophen, nitroGLYCERIN, ondansetron (ZOFRAN) IV, sodium chloride flush   Vital Signs    Vitals:   08/22/20 0309 08/22/20 1357 08/22/20 1933 08/23/20 0327  BP: 108/86 104/78 121/89 109/81  Pulse: 67 (!) 52 71 62  Resp: 18 19 18 17   Temp: 97.8 F (36.6 C) 98.5 F (36.9 C) 98.4 F (36.9 C) 98.2 F (36.8 C)  TempSrc: Oral Oral Oral Oral  SpO2: 99% 97% 98% 98%  Weight: 69.8 kg   70.2 kg  Height:        Intake/Output Summary (Last 24 hours) at 08/23/2020 0838 Last data filed at 08/23/2020 0700 Gross per 24 hour  Intake 1904.64 ml  Output 725 ml  Net 1179.64 ml   Filed Weights   08/21/20 0700 08/22/20 0309 08/23/20 0327  Weight: 70.7 kg 69.8 kg 70.2 kg    Physical Exam   General: Well developed, well nourished, NAD Neck: Negative for carotid bruits. No JVD Lungs:Clear to ausculation bilaterally. No wheezes, rales, or rhonchi. Breathing is unlabored. Cardiovascular: RRR with S1 S2. No murmurs Abdomen: Soft, non-tender, non-distended. No obvious abdominal masses. Extremities: No edema. Radial pulses 2+ bilaterally Neuro: Alert and oriented. No focal deficits. No facial asymmetry. MAE spontaneously. Psych: Responds to questions appropriately with normal affect.    Labs    Chemistry Recent Labs  Lab 08/21/20 0900 08/22/20 0257 08/23/20 0621  NA 138 138 137  K 3.3* 4.4 3.8  CL 103 107 109  CO2 25 23 22    GLUCOSE 126* 95 99  BUN 26* 25* 23  CREATININE 1.31* 1.29* 1.16  CALCIUM 8.6* 8.8* 8.6*  PROT 6.7 6.1* 6.0*  ALBUMIN 3.4* 3.1* 2.9*  AST 54* 39 39  ALT 32 34 42  ALKPHOS 63 65 66  BILITOT 1.1 0.7 0.5  GFRNONAA >60 >60 >60  ANIONGAP 10 8 6      Hematology Recent Labs  Lab 08/21/20 0302 08/22/20 0257 08/23/20 0621  WBC 14.4* 9.1 8.9  RBC 4.28 4.01* 4.07*  HGB 13.1 12.4* 12.4*  HCT 39.5 37.6* 37.9*  MCV 92.3 93.8 93.1  MCH 30.6 30.9 30.5  MCHC 33.2 33.0 32.7  RDW 13.5 13.4 13.2  PLT 190 170 201    Cardiac EnzymesNo results for input(s): TROPONINI in the last 168 hours. No results for input(s): TROPIPOC in the last 168 hours.   BNP Recent Labs  Lab 08/21/20 0900  BNP 279.1*     DDimer No results for input(s): DDIMER in the last 168 hours.   Radiology    No results found.  Telemetry    08/23/20 NSR with HR in the 60's- Personally Reviewed  ECG    No new tracing as of 08/23/20- Personally Reviewed  Cardiac Studies   CARDIAC CATH: 08/19/2020 Conclusions: 1. Severe three vessel-coronary artery disease, including ulcerated 90% mid LAD stenosis, 90% mid LCx  lesion, and sequential 75% mid RCA and proximal rPAV stenoses. 2. Moderately elevated left ventricular filling pressure (LVEDP 25-30 mmHg) with echo earlier today showing LVEF 30-35%.  Recommendations: 1. Cardiac surgery consultation for CABG. 2. Restart IV heparin 2 hours after TR band removal. 3. Aggressive secondary prevention of coronary artery disease; defer adding P2Y12 inhibitor pending cardiac surgery evaluation. 4. Gentle diuresis; will start furosemide 20 mg IV daily with further titration as needed. 5. Escalate goal-directed medical therapy for acute HFrEF, as blood pressure, heart rate, and renal function allow.  Yvonne Kendall, MD [facility]      ECHO 08/19/2020    1. Definity contrast was used to image the LV . The apex and mid- apical  anterior septal walls are akinetic.  There is swirling of contrast but no  evidence of apical thrombi. . Left ventricular ejection fraction, by  estimation, is 30 to 35%. The left  ventricle has moderately decreased function. The left ventricle  demonstrates regional wall motion abnormalities (see scoring  diagram/findings for description). Left ventricular diastolic parameters  are consistent with Grade II diastolic dysfunction  (pseudonormalization). There is severe akinesis of the left ventricular,  mid-apical anteroseptal wall and apical segment.  2. Right ventricular systolic function is normal. The right ventricular  size is normal.  3. The mitral valve is grossly normal. Trivial mitral valve  regurgitation.  4. The aortic valve is grossly normal. Aortic valve regurgitation is not  visualized.   Patient Profile     65 y.o. male who was admitted 08/19/20 after experiencing digestive symptoms commencing at 9:30 AM.  He ultimately presented to an urgent care center and was recommended to go to [facility] hospital for further evaluation.  At the time he was asymptomatic and ECG confirmed late presentation anterior MI>>cah with severe 3VD>>plan for CABG 08/24/20.   Assessment & Plan    1. Late presenting anterior MI: -Pt had a week long hx of indigestion pain which worsened on the day of presentation. He was seen at an UC center and referred to [facility] given EKG changes consistent with anterior MI. HsT found to be greater than 27000>>transferred from [facility] for cath which showed high-grade severe multivessel CAD which CABG revascularization is recommended.   -Echocardiogram with EF in the 30 to 35% with significant wall motion abnormality involving the mid apical anteroseptal wall and apex.    -TCTS consulted with plans for revascularization with CABG 08/24/20 -Continue ASA, IV heparin, high intensity atorvastatin, metoprolol   2. Acute combined systolic and diastolic CHF: -Echo as above with normal RV function  -LVEDP elevated  on cath therefore has been diuresed with IV lasix 20mg  QD -Euvolemic on exam today  -Low dose losartan added to regimen   3. Tobacco Use: -Long standing>>cessation encouraged  -Nicotine patch in place  4. PAF: -Short episode noted 08/21/20 lasting less than 5 min -CHA2DS2VASc 2 (CHF, CAD) -Continue IV Heparin, beta blocker -Watch for post operative PAF  -No recurrence on tele review  5. HLD: -Last LDL, 152 -Continue high intensity atorvastatin -Plan for recheck in 8 weeks with LFTs  -LDL goal <70  6. Transaminitis: -Resolved -Watch with statin   7. Lung nodule: -Chest CT from 08/22/20 with 31mm lung nodule   -Will need f/u with Dr 4m on discharge  Signed, Tonia Brooms NP-C [facility] Pager: [phone] 08/23/2020, 8:38 AM     For questions or updates, please contact   Please consult [email or web address] for contact info under Cardiology/STEMI.  Patient seen, examined.  Available data reviewed. Agree with findings, assessment, and plan as outlined by Georgie Chard, NP-C.  The patient is independently interviewed and examined.  He is alert, oriented, in no distress.  JVP is normal, HEENT is normal, lungs are clear bilaterally, heart is regular rate and rhythm with no murmur or gallop, abdomen is soft and nontender with no organomegaly, extremities have no edema.  The patient is clinically stable with plans for CABG tomorrow.  He remains on IV heparin and he has had no anginal discomfort over the last 24 hours.  The patient is clinically stable, medications are reviewed and appropriate at this time including aspirin, metoprolol, and atorvastatin 80 mg daily.  Continue IV heparin until CABG.  We will follow-up after surgery.  Tonny Bollman, M.D. 08/23/2020 11:41 AM

## 2020-08-23 NOTE — Anesthesia Preprocedure Evaluation (Addendum)
Anesthesia Evaluation  Patient identified by MRN, date of birth, ID band Patient awake    Reviewed: Allergy & Precautions, NPO status , Patient's Chart, lab work & pertinent test results  Airway Mallampati: III  TM Distance: >3 FB Neck ROM: Full    Dental  (+) Dental Advisory Given   Pulmonary Current Smoker,    breath sounds clear to auscultation       Cardiovascular + CAD and + Past MI   Rhythm:Regular Rate:Normal  EF 30-35%   Neuro/Psych negative neurological ROS     GI/Hepatic negative GI ROS, Neg liver ROS,   Endo/Other  negative endocrine ROS  Renal/GU Renal InsufficiencyRenal disease     Musculoskeletal   Abdominal   Peds  Hematology  (+) anemia ,   Anesthesia Other Findings   Reproductive/Obstetrics                            Lab Results  Component Value Date   WBC 8.9 08/23/2020   HGB 12.4 (L) 08/23/2020   HCT 37.9 (L) 08/23/2020   MCV 93.1 08/23/2020   PLT 201 08/23/2020   Lab Results  Component Value Date   CREATININE 1.16 08/23/2020   BUN 23 08/23/2020   NA 137 08/23/2020   K 3.8 08/23/2020   CL 109 08/23/2020   CO2 22 08/23/2020    Anesthesia Physical Anesthesia Plan  ASA: IV  Anesthesia Plan: General   Post-op Pain Management:    Induction: Intravenous  PONV Risk Score and Plan: 1 and Treatment may vary due to age or medical condition  Airway Management Planned: Oral ETT  Additional Equipment: Arterial line, CVP, PA Cath, TEE and Ultrasound Guidance Line Placement  Intra-op Plan:   Post-operative Plan: Post-operative intubation/ventilation  Informed Consent: I have reviewed the patients History and Physical, chart, labs and discussed the procedure including the risks, benefits and alternatives for the proposed anesthesia with the patient or authorized representative who has indicated his/her understanding and acceptance.     Dental advisory  given  Plan Discussed with: CRNA  Anesthesia Plan Comments:        Anesthesia Quick Evaluation

## 2020-08-24 ENCOUNTER — Inpatient Hospital Stay (HOSPITAL_COMMUNITY): Payer: BC Managed Care – PPO | Admitting: Anesthesiology

## 2020-08-24 ENCOUNTER — Inpatient Hospital Stay (HOSPITAL_COMMUNITY): Payer: BC Managed Care – PPO

## 2020-08-24 ENCOUNTER — Inpatient Hospital Stay (HOSPITAL_COMMUNITY)
Admission: EM | Disposition: A | Discharge status: Home / Self Care | Payer: Self-pay | Source: Home / Self Care | Attending: Cardiothoracic Surgery

## 2020-08-24 DIAGNOSIS — I2510 Atherosclerotic heart disease of native coronary artery without angina pectoris: Secondary | ICD-10-CM

## 2020-08-24 DIAGNOSIS — Z951 Presence of aortocoronary bypass graft: Secondary | ICD-10-CM

## 2020-08-24 HISTORY — PX: CORONARY ARTERY BYPASS GRAFT: SHX141

## 2020-08-24 HISTORY — PX: TEE WITHOUT CARDIOVERSION: SHX5443

## 2020-08-24 LAB — CBC
HCT: 35.5 % — ABNORMAL LOW (ref 39.0–52.0)
HCT: 30.3 % — ABNORMAL LOW (ref 39.0–52.0)
HCT: 29.7 % — ABNORMAL LOW (ref 39.0–52.0)
Hemoglobin: 11.7 g/dL — ABNORMAL LOW (ref 13.0–17.0)
Hemoglobin: 9.8 g/dL — ABNORMAL LOW (ref 13.0–17.0)
Hemoglobin: 9.8 g/dL — ABNORMAL LOW (ref 13.0–17.0)
MCH: 31.1 pg (ref 26.0–34.0)
MCH: 30.5 pg (ref 26.0–34.0)
MCH: 31.2 pg (ref 26.0–34.0)
MCHC: 33 g/dL (ref 30.0–36.0)
MCHC: 32.3 g/dL (ref 30.0–36.0)
MCHC: 33 g/dL (ref 30.0–36.0)
MCV: 94.4 fL (ref 80.0–100.0)
MCV: 94.4 fL (ref 80.0–100.0)
MCV: 94.6 fL (ref 80.0–100.0)
Platelets: 191 10*3/uL (ref 150–400)
Platelets: 139 10*3/uL — ABNORMAL LOW (ref 150–400)
Platelets: 167 10*3/uL (ref 150–400)
RBC: 3.76 MIL/uL — ABNORMAL LOW (ref 4.22–5.81)
RBC: 3.21 MIL/uL — ABNORMAL LOW (ref 4.22–5.81)
RBC: 3.14 MIL/uL — ABNORMAL LOW (ref 4.22–5.81)
RDW: 13.2 % (ref 11.5–15.5)
RDW: 13.1 % (ref 11.5–15.5)
RDW: 13.2 % (ref 11.5–15.5)
WBC: 8.4 10*3/uL (ref 4.0–10.5)
WBC: 18.1 10*3/uL — ABNORMAL HIGH (ref 4.0–10.5)
WBC: 15.9 10*3/uL — ABNORMAL HIGH (ref 4.0–10.5)
nRBC: 0 % (ref 0.0–0.2)
nRBC: 0 % (ref 0.0–0.2)
nRBC: 0 % (ref 0.0–0.2)

## 2020-08-24 LAB — COMPREHENSIVE METABOLIC PANEL
ALT: 42 U/L (ref 0–44)
AST: 40 U/L (ref 15–41)
Albumin: 2.9 g/dL — ABNORMAL LOW (ref 3.5–5.0)
Alkaline Phosphatase: 65 U/L (ref 38–126)
Anion gap: 6 (ref 5–15)
BUN: 22 mg/dL (ref 8–23)
CO2: 24 mmol/L (ref 22–32)
Calcium: 8.4 mg/dL — ABNORMAL LOW (ref 8.9–10.3)
Chloride: 107 mmol/L (ref 98–111)
Creatinine, Ser: 1.2 mg/dL (ref 0.61–1.24)
GFR, Estimated: >60 mL/min (ref >60)
Glucose, Bld: 108 mg/dL — ABNORMAL HIGH (ref 70–99)
Potassium: 3.8 mmol/L (ref 3.5–5.1)
Sodium: 137 mmol/L (ref 135–145)
Total Bilirubin: 0.6 mg/dL (ref 0.3–1.2)
Total Protein: 5.8 g/dL — ABNORMAL LOW (ref 6.5–8.1)

## 2020-08-24 LAB — GLUCOSE, CAPILLARY
Glucose-Capillary: 107 mg/dL — ABNORMAL HIGH (ref 70–99)
Glucose-Capillary: 109 mg/dL — ABNORMAL HIGH (ref 70–99)
Glucose-Capillary: 108 mg/dL — ABNORMAL HIGH (ref 70–99)
Glucose-Capillary: 156 mg/dL — ABNORMAL HIGH (ref 70–99)
Glucose-Capillary: 160 mg/dL — ABNORMAL HIGH (ref 70–99)
Glucose-Capillary: 181 mg/dL — ABNORMAL HIGH (ref 70–99)

## 2020-08-24 LAB — BASIC METABOLIC PANEL
Anion gap: 9 (ref 5–15)
BUN: 16 mg/dL (ref 8–23)
CO2: 21 mmol/L — ABNORMAL LOW (ref 22–32)
Calcium: 7.5 mg/dL — ABNORMAL LOW (ref 8.9–10.3)
Chloride: 107 mmol/L (ref 98–111)
Creatinine, Ser: 1.09 mg/dL (ref 0.61–1.24)
GFR, Estimated: >60 mL/min (ref >60)
Glucose, Bld: 160 mg/dL — ABNORMAL HIGH (ref 70–99)
Potassium: 4 mmol/L (ref 3.5–5.1)
Sodium: 137 mmol/L (ref 135–145)

## 2020-08-24 LAB — POCT I-STAT 7, (LYTES, BLD GAS, ICA,H+H)
Acid-base deficit: 1 mmol/L (ref 0.0–2.0)
Acid-base deficit: 3 mmol/L — ABNORMAL HIGH (ref 0.0–2.0)
Acid-base deficit: 3 mmol/L — ABNORMAL HIGH (ref 0.0–2.0)
Acid-base deficit: 4 mmol/L — ABNORMAL HIGH (ref 0.0–2.0)
Acid-base deficit: 5 mmol/L — ABNORMAL HIGH (ref 0.0–2.0)
Acid-base deficit: 5 mmol/L — ABNORMAL HIGH (ref 0.0–2.0)
Bicarbonate: 22.3 mmol/L (ref 20.0–28.0)
Bicarbonate: 23.1 mmol/L (ref 20.0–28.0)
Bicarbonate: 22.7 mmol/L (ref 20.0–28.0)
Bicarbonate: 21.8 mmol/L (ref 20.0–28.0)
Bicarbonate: 20.3 mmol/L (ref 20.0–28.0)
Bicarbonate: 20 mmol/L (ref 20.0–28.0)
Calcium, Ion: 1 mmol/L — ABNORMAL LOW (ref 1.15–1.40)
Calcium, Ion: 1.06 mmol/L — ABNORMAL LOW (ref 1.15–1.40)
Calcium, Ion: 1.03 mmol/L — ABNORMAL LOW (ref 1.15–1.40)
Calcium, Ion: 1.11 mmol/L — ABNORMAL LOW (ref 1.15–1.40)
Calcium, Ion: 1.09 mmol/L — ABNORMAL LOW (ref 1.15–1.40)
Calcium, Ion: 1.12 mmol/L — ABNORMAL LOW (ref 1.15–1.40)
HCT: 23 % — ABNORMAL LOW (ref 39.0–52.0)
HCT: 26 % — ABNORMAL LOW (ref 39.0–52.0)
HCT: 27 % — ABNORMAL LOW (ref 39.0–52.0)
HCT: 30 % — ABNORMAL LOW (ref 39.0–52.0)
HCT: 30 % — ABNORMAL LOW (ref 39.0–52.0)
HCT: 27 % — ABNORMAL LOW (ref 39.0–52.0)
Hemoglobin: 7.8 g/dL — ABNORMAL LOW (ref 13.0–17.0)
Hemoglobin: 8.8 g/dL — ABNORMAL LOW (ref 13.0–17.0)
Hemoglobin: 9.2 g/dL — ABNORMAL LOW (ref 13.0–17.0)
Hemoglobin: 10.2 g/dL — ABNORMAL LOW (ref 13.0–17.0)
Hemoglobin: 10.2 g/dL — ABNORMAL LOW (ref 13.0–17.0)
Hemoglobin: 9.2 g/dL — ABNORMAL LOW (ref 13.0–17.0)
O2 Saturation: 100 %
O2 Saturation: 100 %
O2 Saturation: 100 %
O2 Saturation: 98 %
O2 Saturation: 98 %
O2 Saturation: 96 %
Patient temperature: 35.8
Patient temperature: 36.8
Potassium: 4.6 mmol/L (ref 3.5–5.1)
Potassium: 5.8 mmol/L — ABNORMAL HIGH (ref 3.5–5.1)
Potassium: 4.8 mmol/L (ref 3.5–5.1)
Potassium: 4.6 mmol/L (ref 3.5–5.1)
Potassium: 4.1 mmol/L (ref 3.5–5.1)
Potassium: 3.9 mmol/L (ref 3.5–5.1)
Sodium: 138 mmol/L (ref 135–145)
Sodium: 138 mmol/L (ref 135–145)
Sodium: 140 mmol/L (ref 135–145)
Sodium: 139 mmol/L (ref 135–145)
Sodium: 141 mmol/L (ref 135–145)
Sodium: 140 mmol/L (ref 135–145)
TCO2: 24 mmol/L (ref 22–32)
TCO2: 24 mmol/L (ref 22–32)
TCO2: 24 mmol/L (ref 22–32)
TCO2: 23 mmol/L (ref 22–32)
TCO2: 21 mmol/L — ABNORMAL LOW (ref 22–32)
TCO2: 21 mmol/L — ABNORMAL LOW (ref 22–32)
pCO2 arterial: 36.2 mmHg (ref 32.0–48.0)
pCO2 arterial: 39.9 mmHg (ref 32.0–48.0)
pCO2 arterial: 45.2 mmHg (ref 32.0–48.0)
pCO2 arterial: 41.5 mmHg (ref 32.0–48.0)
pCO2 arterial: 39.5 mmHg (ref 32.0–48.0)
pCO2 arterial: 36.6 mmHg (ref 32.0–48.0)
pH, Arterial: 7.357 (ref 7.350–7.450)
pH, Arterial: 7.414 (ref 7.350–7.450)
pH, Arterial: 7.31 — ABNORMAL LOW (ref 7.350–7.450)
pH, Arterial: 7.322 — ABNORMAL LOW (ref 7.350–7.450)
pH, Arterial: 7.318 — ABNORMAL LOW (ref 7.350–7.450)
pH, Arterial: 7.344 — ABNORMAL LOW (ref 7.350–7.450)
pO2, Arterial: 337 mmHg — ABNORMAL HIGH (ref 83.0–108.0)
pO2, Arterial: 394 mmHg — ABNORMAL HIGH (ref 83.0–108.0)
pO2, Arterial: 354 mmHg — ABNORMAL HIGH (ref 83.0–108.0)
pO2, Arterial: 109 mmHg — ABNORMAL HIGH (ref 83.0–108.0)
pO2, Arterial: 105 mmHg (ref 83.0–108.0)
pO2, Arterial: 88 mmHg (ref 83.0–108.0)

## 2020-08-24 LAB — POCT I-STAT EG7
Acid-base deficit: 3 mmol/L — ABNORMAL HIGH (ref 0.0–2.0)
Bicarbonate: 22.5 mmol/L (ref 20.0–28.0)
Calcium, Ion: 1.05 mmol/L — ABNORMAL LOW (ref 1.15–1.40)
HCT: 24 % — ABNORMAL LOW (ref 39.0–52.0)
Hemoglobin: 8.2 g/dL — ABNORMAL LOW (ref 13.0–17.0)
O2 Saturation: 83 %
Potassium: 5.8 mmol/L — ABNORMAL HIGH (ref 3.5–5.1)
Sodium: 138 mmol/L (ref 135–145)
TCO2: 24 mmol/L (ref 22–32)
pCO2, Ven: 42.6 mmHg — ABNORMAL LOW (ref 44.0–60.0)
pH, Ven: 7.331 (ref 7.250–7.430)
pO2, Ven: 50 mmHg — ABNORMAL HIGH (ref 32.0–45.0)

## 2020-08-24 LAB — POCT I-STAT, CHEM 8
BUN: 19 mg/dL (ref 8–23)
BUN: 19 mg/dL (ref 8–23)
BUN: 18 mg/dL (ref 8–23)
BUN: 18 mg/dL (ref 8–23)
Calcium, Ion: 1.25 mmol/L (ref 1.15–1.40)
Calcium, Ion: 1.24 mmol/L (ref 1.15–1.40)
Calcium, Ion: 1.07 mmol/L — ABNORMAL LOW (ref 1.15–1.40)
Calcium, Ion: 1.04 mmol/L — ABNORMAL LOW (ref 1.15–1.40)
Chloride: 106 mmol/L (ref 98–111)
Chloride: 106 mmol/L (ref 98–111)
Chloride: 105 mmol/L (ref 98–111)
Chloride: 104 mmol/L (ref 98–111)
Creatinine, Ser: 0.8 mg/dL (ref 0.61–1.24)
Creatinine, Ser: 0.9 mg/dL (ref 0.61–1.24)
Creatinine, Ser: 0.8 mg/dL (ref 0.61–1.24)
Creatinine, Ser: 0.8 mg/dL (ref 0.61–1.24)
Glucose, Bld: 118 mg/dL — ABNORMAL HIGH (ref 70–99)
Glucose, Bld: 98 mg/dL (ref 70–99)
Glucose, Bld: 115 mg/dL — ABNORMAL HIGH (ref 70–99)
Glucose, Bld: 107 mg/dL — ABNORMAL HIGH (ref 70–99)
HCT: 33 % — ABNORMAL LOW (ref 39.0–52.0)
HCT: 31 % — ABNORMAL LOW (ref 39.0–52.0)
HCT: 25 % — ABNORMAL LOW (ref 39.0–52.0)
HCT: 23 % — ABNORMAL LOW (ref 39.0–52.0)
Hemoglobin: 11.2 g/dL — ABNORMAL LOW (ref 13.0–17.0)
Hemoglobin: 10.5 g/dL — ABNORMAL LOW (ref 13.0–17.0)
Hemoglobin: 8.5 g/dL — ABNORMAL LOW (ref 13.0–17.0)
Hemoglobin: 7.8 g/dL — ABNORMAL LOW (ref 13.0–17.0)
Potassium: 3.9 mmol/L (ref 3.5–5.1)
Potassium: 4.3 mmol/L (ref 3.5–5.1)
Potassium: 5.3 mmol/L — ABNORMAL HIGH (ref 3.5–5.1)
Potassium: 5.4 mmol/L — ABNORMAL HIGH (ref 3.5–5.1)
Sodium: 140 mmol/L (ref 135–145)
Sodium: 138 mmol/L (ref 135–145)
Sodium: 136 mmol/L (ref 135–145)
Sodium: 136 mmol/L (ref 135–145)
TCO2: 22 mmol/L (ref 22–32)
TCO2: 22 mmol/L (ref 22–32)
TCO2: 24 mmol/L (ref 22–32)
TCO2: 24 mmol/L (ref 22–32)

## 2020-08-24 LAB — BLOOD GAS, ARTERIAL
Acid-base deficit: 2.7 mmol/L — ABNORMAL HIGH (ref 0.0–2.0)
Bicarbonate: 21.1 mmol/L (ref 20.0–28.0)
Drawn by: 34762
FIO2: 21
O2 Saturation: 97.8 %
Patient temperature: 36.7
pCO2 arterial: 33.5 mmHg (ref 32.0–48.0)
pH, Arterial: 7.415 (ref 7.350–7.450)
pO2, Arterial: 101 mmHg (ref 83.0–108.0)

## 2020-08-24 LAB — HEPARIN LEVEL (UNFRACTIONATED): Heparin Unfractionated: 0.61 IU/mL (ref 0.30–0.70)

## 2020-08-24 LAB — HEMOGLOBIN AND HEMATOCRIT, BLOOD
HCT: 24 % — ABNORMAL LOW (ref 39.0–52.0)
Hemoglobin: 8.1 g/dL — ABNORMAL LOW (ref 13.0–17.0)

## 2020-08-24 LAB — SURGICAL PCR SCREEN
MRSA, PCR: NEGATIVE
Staphylococcus aureus: NEGATIVE

## 2020-08-24 LAB — APTT
aPTT: 136 seconds — ABNORMAL HIGH (ref 24–36)
aPTT: 33 seconds (ref 24–36)

## 2020-08-24 LAB — PROTIME-INR
INR: 1.2 (ref 0.8–1.2)
Prothrombin Time: 15.4 seconds — ABNORMAL HIGH (ref 11.4–15.2)

## 2020-08-24 LAB — PLATELET COUNT: Platelets: 154 10*3/uL (ref 150–400)

## 2020-08-24 LAB — MAGNESIUM: Magnesium: 3.3 mg/dL — ABNORMAL HIGH (ref 1.7–2.4)

## 2020-08-24 SURGERY — CORONARY ARTERY BYPASS GRAFTING (CABG)
Anesthesia: General | Site: Chest

## 2020-08-24 MED ORDER — MIDAZOLAM HCL 2 MG/2ML IJ SOLN: 2.0000 mg | INTRAMUSCULAR | Status: DC | PRN

## 2020-08-24 MED ORDER — ASPIRIN 81 MG PO CHEW: 324.0000 mg | CHEWABLE_TABLET | Freq: Every day | ORAL | Status: DC

## 2020-08-24 MED ORDER — OXYCODONE HCL 5 MG PO TABS
5.0000 mg | ORAL_TABLET | ORAL | Status: DC | PRN
  Administered 2020-08-26: 10 mg via ORAL
  Administered 2020-08-27: 5 mg via ORAL
  Filled 2020-08-24 (×3): qty 2

## 2020-08-24 MED ORDER — CHLORHEXIDINE GLUCONATE CLOTH 2 % EX PADS
6.0000 | MEDICATED_PAD | Freq: Every day | CUTANEOUS | Status: DC
  Administered 2020-08-24 – 2020-08-28 (×5): 6 via TOPICAL

## 2020-08-24 MED ORDER — PROPOFOL 10 MG/ML IV BOLUS
INTRAVENOUS | Status: DC | PRN
  Administered 2020-08-24: 30 mg via INTRAVENOUS

## 2020-08-24 MED ORDER — CHLORHEXIDINE GLUCONATE CLOTH 2 % EX PADS: 6.0000 | MEDICATED_PAD | Freq: Every day | CUTANEOUS | Status: DC

## 2020-08-24 MED ORDER — DEXTROSE 50 % IV SOLN
0.0000 mL | INTRAVENOUS | Status: DC | PRN
Start: 2020-08-24 — End: 2020-08-24

## 2020-08-24 MED ORDER — FENTANYL CITRATE (PF) 250 MCG/5ML IJ SOLN
INTRAMUSCULAR | Status: AC
  Filled 2020-08-24: qty 25

## 2020-08-24 MED ORDER — DEXMEDETOMIDINE HCL IN NACL 400 MCG/100ML IV SOLN: 0.0000 ug/kg/h | INTRAVENOUS | Status: DC

## 2020-08-24 MED ORDER — METOPROLOL TARTRATE 5 MG/5ML IV SOLN
2.5000 mg | INTRAVENOUS | Status: DC | PRN
Start: 2020-08-24 — End: 2020-08-28
  Administered 2020-08-27: 5 mg via INTRAVENOUS
  Filled 2020-08-24 (×3): qty 5

## 2020-08-24 MED ORDER — FAMOTIDINE IN NACL 20-0.9 MG/50ML-% IV SOLN
20.0000 mg | Freq: Two times a day (BID) | INTRAVENOUS | Status: AC
  Administered 2020-08-24 – 2020-08-25 (×2): 20 mg via INTRAVENOUS
  Filled 2020-08-24 (×2): qty 50

## 2020-08-24 MED ORDER — FENTANYL CITRATE (PF) 250 MCG/5ML IJ SOLN
INTRAMUSCULAR | Status: DC | PRN
  Administered 2020-08-24: 400 ug via INTRAVENOUS
  Administered 2020-08-24: 50 ug via INTRAVENOUS
  Administered 2020-08-24: 250 ug via INTRAVENOUS
  Administered 2020-08-24 (×2): 50 ug via INTRAVENOUS

## 2020-08-24 MED ORDER — SODIUM BICARBONATE 8.4 % IV SOLN
100.0000 meq | Freq: Once | INTRAVENOUS | Status: AC
  Administered 2020-08-24: 100 meq via INTRAVENOUS

## 2020-08-24 MED ORDER — VANCOMYCIN HCL 1000 MG IV SOLR
INTRAVENOUS | Status: DC | PRN
  Administered 2020-08-24 (×3): 1 g via TOPICAL

## 2020-08-24 MED ORDER — LACTATED RINGERS IV SOLN: INTRAVENOUS | Status: DC

## 2020-08-24 MED ORDER — LACTATED RINGERS IV SOLN: INTRAVENOUS | Status: DC | PRN

## 2020-08-24 MED ORDER — ALBUMIN HUMAN 5 % IV SOLN
250.0000 mL | INTRAVENOUS | Status: AC | PRN
  Administered 2020-08-24 – 2020-08-25 (×4): 12.5 g via INTRAVENOUS
  Filled 2020-08-24 (×2): qty 250

## 2020-08-24 MED ORDER — BUPIVACAINE LIPOSOME 1.3 % IJ SUSP
INTRAMUSCULAR | Status: DC | PRN
  Administered 2020-08-24: 50 mL

## 2020-08-24 MED ORDER — PHENYLEPHRINE 40 MCG/ML (10ML) SYRINGE FOR IV PUSH (FOR BLOOD PRESSURE SUPPORT)
PREFILLED_SYRINGE | INTRAVENOUS | Status: DC | PRN
  Administered 2020-08-24: 120 ug via INTRAVENOUS
  Administered 2020-08-24: 80 ug via INTRAVENOUS
  Administered 2020-08-24: 120 ug via INTRAVENOUS
  Administered 2020-08-24 (×2): 40 ug via INTRAVENOUS
  Administered 2020-08-24: 80 ug via INTRAVENOUS

## 2020-08-24 MED ORDER — BUPIVACAINE HCL (PF) 0.5 % IJ SOLN
INTRAMUSCULAR | Status: AC
  Filled 2020-08-24: qty 30

## 2020-08-24 MED ORDER — ACETAMINOPHEN 650 MG RE SUPP
650.0000 mg | Freq: Once | RECTAL | Status: AC
  Administered 2020-08-24: 650 mg via RECTAL

## 2020-08-24 MED ORDER — LEVALBUTEROL HCL 0.63 MG/3ML IN NEBU
0.6300 mg | INHALATION_SOLUTION | Freq: Four times a day (QID) | RESPIRATORY_TRACT | Status: DC
  Administered 2020-08-24 (×2): 0.63 mg via RESPIRATORY_TRACT
  Filled 2020-08-24 (×2): qty 3

## 2020-08-24 MED ORDER — SODIUM CHLORIDE 0.9% FLUSH: 10.0000 mL | INTRAVENOUS | Status: DC | PRN

## 2020-08-24 MED ORDER — PHENYLEPHRINE HCL (PRESSORS) 10 MG/ML IV SOLN
INTRAVENOUS | Status: AC
  Filled 2020-08-24: qty 1

## 2020-08-24 MED ORDER — ACETAMINOPHEN 160 MG/5ML PO SOLN: 1000.0000 mg | Freq: Four times a day (QID) | ORAL | Status: DC

## 2020-08-24 MED ORDER — PROTAMINE SULFATE 10 MG/ML IV SOLN
INTRAVENOUS | Status: AC
  Filled 2020-08-24: qty 25

## 2020-08-24 MED ORDER — MAGNESIUM SULFATE 4 GM/100ML IV SOLN
4.0000 g | Freq: Once | INTRAVENOUS | Status: AC
  Administered 2020-08-24: 4 g via INTRAVENOUS
  Filled 2020-08-24: qty 100

## 2020-08-24 MED ORDER — MORPHINE SULFATE (PF) 2 MG/ML IV SOLN
1.0000 mg | INTRAVENOUS | Status: DC | PRN
Start: 2020-08-24 — End: 2020-08-28

## 2020-08-24 MED ORDER — BISACODYL 10 MG RE SUPP
10.0000 mg | Freq: Every day | RECTAL | Status: DC
  Filled 2020-08-24: qty 1

## 2020-08-24 MED ORDER — ACETAMINOPHEN 500 MG PO TABS
1000.0000 mg | ORAL_TABLET | Freq: Four times a day (QID) | ORAL | Status: DC
  Administered 2020-08-25 – 2020-08-27 (×11): 1000 mg via ORAL
  Filled 2020-08-24 (×12): qty 2

## 2020-08-24 MED ORDER — HEMOSTATIC AGENTS (NO CHARGE) OPTIME
TOPICAL | Status: DC | PRN
  Administered 2020-08-24: 1 via TOPICAL

## 2020-08-24 MED ORDER — SODIUM CHLORIDE 0.9 % IV SOLN: INTRAVENOUS | Status: DC

## 2020-08-24 MED ORDER — SODIUM CHLORIDE 0.9% FLUSH: 3.0000 mL | INTRAVENOUS | Status: DC | PRN

## 2020-08-24 MED ORDER — ONDANSETRON HCL 4 MG/2ML IJ SOLN
4.0000 mg | Freq: Four times a day (QID) | INTRAMUSCULAR | Status: DC | PRN
  Administered 2020-08-26 – 2020-08-28 (×5): 4 mg via INTRAVENOUS
  Filled 2020-08-24 (×5): qty 2

## 2020-08-24 MED ORDER — BUPIVACAINE LIPOSOME 1.3 % IJ SUSP
INTRAMUSCULAR | Status: AC
  Filled 2020-08-24: qty 20

## 2020-08-24 MED ORDER — TRAMADOL HCL 50 MG PO TABS
50.0000 mg | ORAL_TABLET | ORAL | Status: DC | PRN
  Administered 2020-08-24: 100 mg via ORAL
  Administered 2020-08-25 – 2020-08-26 (×5): 50 mg via ORAL
  Filled 2020-08-24 (×3): qty 1
  Filled 2020-08-24: qty 2
  Filled 2020-08-24 (×2): qty 1

## 2020-08-24 MED ORDER — EPINEPHRINE HCL 5 MG/250ML IV SOLN IN NS: 2.0000 ug/min | INTRAVENOUS | Status: DC

## 2020-08-24 MED ORDER — SODIUM CHLORIDE 0.45 % IV SOLN: INTRAVENOUS | Status: DC | PRN

## 2020-08-24 MED ORDER — LIDOCAINE 2% (20 MG/ML) 5 ML SYRINGE
INTRAMUSCULAR | Status: DC | PRN
  Administered 2020-08-24: 80 mg via INTRAVENOUS

## 2020-08-24 MED ORDER — INSULIN REGULAR(HUMAN) IN NACL 100-0.9 UT/100ML-% IV SOLN: INTRAVENOUS | Status: DC

## 2020-08-24 MED ORDER — NITROGLYCERIN IN D5W 200-5 MCG/ML-% IV SOLN: 0.0000 ug/min | INTRAVENOUS | Status: DC

## 2020-08-24 MED ORDER — DIPHENHYDRAMINE HCL 50 MG/ML IJ SOLN
INTRAMUSCULAR | Status: DC | PRN
  Administered 2020-08-24: 25 mg via INTRAVENOUS

## 2020-08-24 MED ORDER — PLASMA-LYTE 148 IV SOLN
INTRAVENOUS | Status: DC | PRN
  Administered 2020-08-24: 500 mL

## 2020-08-24 MED ORDER — LACTATED RINGERS IV SOLN: 500.0000 mL | Freq: Once | INTRAVENOUS | Status: DC | PRN

## 2020-08-24 MED ORDER — ROCURONIUM BROMIDE 10 MG/ML (PF) SYRINGE
PREFILLED_SYRINGE | INTRAVENOUS | Status: AC
  Filled 2020-08-24: qty 20

## 2020-08-24 MED ORDER — 0.9 % SODIUM CHLORIDE (POUR BTL) OPTIME
TOPICAL | Status: DC | PRN
  Administered 2020-08-24: 5000 mL

## 2020-08-24 MED ORDER — PHENYLEPHRINE HCL-NACL 20-0.9 MG/250ML-% IV SOLN
0.0000 ug/min | INTRAVENOUS | Status: DC
  Filled 2020-08-24: qty 250

## 2020-08-24 MED ORDER — CEFAZOLIN SODIUM-DEXTROSE 2-4 GM/100ML-% IV SOLN
2.0000 g | Freq: Three times a day (TID) | INTRAVENOUS | Status: AC
  Administered 2020-08-24 – 2020-08-26 (×6): 2 g via INTRAVENOUS
  Filled 2020-08-24 (×6): qty 100

## 2020-08-24 MED ORDER — PLASMA-LYTE A IV SOLN: INTRAVENOUS | Status: DC

## 2020-08-24 MED ORDER — STERILE WATER FOR INJECTION IJ SOLN
INTRAMUSCULAR | Status: DC | PRN
  Administered 2020-08-24: 10 mL via INTRACORONARY

## 2020-08-24 MED ORDER — SODIUM CHLORIDE 0.9% FLUSH
3.0000 mL | Freq: Two times a day (BID) | INTRAVENOUS | Status: DC
  Administered 2020-08-25 – 2020-08-26 (×2): 3 mL via INTRAVENOUS

## 2020-08-24 MED ORDER — LIDOCAINE 2% (20 MG/ML) 5 ML SYRINGE
INTRAMUSCULAR | Status: AC
  Filled 2020-08-24: qty 5

## 2020-08-24 MED ORDER — MIDAZOLAM HCL (PF) 5 MG/ML IJ SOLN
INTRAMUSCULAR | Status: DC | PRN
  Administered 2020-08-24: 1 mg via INTRAVENOUS
  Administered 2020-08-24 (×2): 2 mg via INTRAVENOUS
  Administered 2020-08-24: 4 mg via INTRAVENOUS
  Administered 2020-08-24: 1 mg via INTRAVENOUS

## 2020-08-24 MED ORDER — DOCUSATE SODIUM 100 MG PO CAPS
200.0000 mg | ORAL_CAPSULE | Freq: Every day | ORAL | Status: DC
  Administered 2020-08-25 – 2020-08-27 (×3): 200 mg via ORAL
  Filled 2020-08-24 (×3): qty 2

## 2020-08-24 MED ORDER — ALBUMIN HUMAN 5 % IV SOLN: INTRAVENOUS | Status: DC | PRN

## 2020-08-24 MED ORDER — PLATELET RICH PLASMA OPTIME
Status: DC | PRN
  Administered 2020-08-24: 10 mL

## 2020-08-24 MED ORDER — VANCOMYCIN HCL IN DEXTROSE 1-5 GM/200ML-% IV SOLN
1000.0000 mg | Freq: Once | INTRAVENOUS | Status: AC
  Administered 2020-08-24: 1000 mg via INTRAVENOUS
  Filled 2020-08-24: qty 200

## 2020-08-24 MED ORDER — LEVALBUTEROL TARTRATE 45 MCG/ACT IN AERO
2.0000 | INHALATION_SPRAY | Freq: Four times a day (QID) | RESPIRATORY_TRACT | Status: DC
  Filled 2020-08-24: qty 15

## 2020-08-24 MED ORDER — STERILE WATER FOR INJECTION IJ SOLN
INTRAMUSCULAR | Status: AC
  Filled 2020-08-24: qty 10

## 2020-08-24 MED ORDER — PLASMA-LYTE 148 IV SOLN: INTRAVENOUS | Status: DC | PRN

## 2020-08-24 MED ORDER — ASPIRIN EC 325 MG PO TBEC
325.0000 mg | DELAYED_RELEASE_TABLET | Freq: Every day | ORAL | Status: DC
  Administered 2020-08-25: 325 mg via ORAL
  Filled 2020-08-24: qty 1

## 2020-08-24 MED ORDER — MIDAZOLAM HCL (PF) 10 MG/2ML IJ SOLN
INTRAMUSCULAR | Status: AC
  Filled 2020-08-24: qty 2

## 2020-08-24 MED ORDER — PLATELET POOR PLASMA OPTIME
Status: DC | PRN
  Administered 2020-08-24: 10 mL

## 2020-08-24 MED ORDER — POTASSIUM CHLORIDE 10 MEQ/50ML IV SOLN: 10.0000 meq | INTRAVENOUS | Status: AC

## 2020-08-24 MED ORDER — PHENYLEPHRINE 40 MCG/ML (10ML) SYRINGE FOR IV PUSH (FOR BLOOD PRESSURE SUPPORT)
PREFILLED_SYRINGE | INTRAVENOUS | Status: AC
  Filled 2020-08-24: qty 10

## 2020-08-24 MED ORDER — HEPARIN SODIUM (PORCINE) 1000 UNIT/ML IJ SOLN
INTRAMUSCULAR | Status: DC | PRN
  Administered 2020-08-24: 19000 [IU] via INTRAVENOUS
  Administered 2020-08-24: 3000 [IU] via INTRAVENOUS

## 2020-08-24 MED ORDER — PROPOFOL 10 MG/ML IV BOLUS
INTRAVENOUS | Status: AC
  Filled 2020-08-24: qty 20

## 2020-08-24 MED ORDER — HEPARIN SODIUM (PORCINE) 1000 UNIT/ML IJ SOLN
INTRAMUSCULAR | Status: AC
  Filled 2020-08-24: qty 1

## 2020-08-24 MED ORDER — CHLORHEXIDINE GLUCONATE 0.12 % MT SOLN
15.0000 mL | OROMUCOSAL | Status: AC
  Administered 2020-08-24: 15 mL via OROMUCOSAL

## 2020-08-24 MED ORDER — INSULIN ASPART 100 UNIT/ML IJ SOLN
0.0000 [IU] | INTRAMUSCULAR | Status: DC
  Administered 2020-08-24: 2 [IU] via SUBCUTANEOUS
  Administered 2020-08-25: 4 [IU] via SUBCUTANEOUS
  Administered 2020-08-25 – 2020-08-27 (×3): 2 [IU] via SUBCUTANEOUS
  Administered 2020-08-28: 4 [IU] via SUBCUTANEOUS

## 2020-08-24 MED ORDER — BISACODYL 5 MG PO TBEC
10.0000 mg | DELAYED_RELEASE_TABLET | Freq: Every day | ORAL | Status: DC
  Administered 2020-08-25 – 2020-08-27 (×3): 10 mg via ORAL
  Filled 2020-08-24 (×3): qty 2

## 2020-08-24 MED ORDER — METOPROLOL TARTRATE 12.5 MG HALF TABLET
12.5000 mg | ORAL_TABLET | Freq: Two times a day (BID) | ORAL | Status: DC
  Administered 2020-08-24: 12.5 mg via ORAL
  Filled 2020-08-24: qty 1

## 2020-08-24 MED ORDER — SODIUM CHLORIDE 0.9% FLUSH
10.0000 mL | Freq: Two times a day (BID) | INTRAVENOUS | Status: DC
  Administered 2020-08-24 – 2020-08-26 (×4): 10 mL

## 2020-08-24 MED ORDER — THROMBIN 5000 UNITS EX SOLR
INTRAVENOUS | Status: DC | PRN
  Administered 2020-08-24: 2 mL

## 2020-08-24 MED ORDER — SODIUM CHLORIDE 0.9 % IV SOLN: 250.0000 mL | INTRAVENOUS | Status: DC

## 2020-08-24 MED ORDER — MIDAZOLAM HCL 5 MG/5ML IJ SOLN: INTRAMUSCULAR | Status: DC | PRN

## 2020-08-24 MED ORDER — PROTAMINE SULFATE 10 MG/ML IV SOLN
INTRAVENOUS | Status: DC | PRN
  Administered 2020-08-24: 200 mg via INTRAVENOUS

## 2020-08-24 MED ORDER — VANCOMYCIN HCL 1000 MG IV SOLR
INTRAVENOUS | Status: AC
  Filled 2020-08-24: qty 3000

## 2020-08-24 MED ORDER — METOPROLOL TARTRATE 25 MG/10 ML ORAL SUSPENSION: 12.5000 mg | Freq: Two times a day (BID) | ORAL | Status: DC

## 2020-08-24 MED ORDER — PANTOPRAZOLE SODIUM 40 MG PO TBEC
40.0000 mg | DELAYED_RELEASE_TABLET | Freq: Every day | ORAL | Status: DC
  Administered 2020-08-26 – 2020-08-28 (×3): 40 mg via ORAL
  Filled 2020-08-24 (×3): qty 1

## 2020-08-24 MED ORDER — ROCURONIUM BROMIDE 10 MG/ML (PF) SYRINGE
PREFILLED_SYRINGE | INTRAVENOUS | Status: DC | PRN
  Administered 2020-08-24: 50 mg via INTRAVENOUS
  Administered 2020-08-24: 30 mg via INTRAVENOUS
  Administered 2020-08-24: 70 mg via INTRAVENOUS
  Administered 2020-08-24: 20 mg via INTRAVENOUS
  Administered 2020-08-24: 50 mg via INTRAVENOUS

## 2020-08-24 MED ORDER — DEXAMETHASONE SODIUM PHOSPHATE 10 MG/ML IJ SOLN
INTRAMUSCULAR | Status: DC | PRN
  Administered 2020-08-24: 10 mg via INTRAVENOUS

## 2020-08-24 MED ORDER — ACETAMINOPHEN 160 MG/5ML PO SOLN: 650.0000 mg | Freq: Once | ORAL | Status: AC

## 2020-08-24 SURGICAL SUPPLY — 90 items
ADAPTER CARDIO PERF ANTE/RETRO (ADAPTER) ×3 IMPLANT
APPLICATOR TIP COSEAL (VASCULAR PRODUCTS) IMPLANT
BAG DECANTER FOR FLEXI CONT (MISCELLANEOUS) ×3 IMPLANT
BLADE 15 SAFETY STRL DISP (BLADE) ×3 IMPLANT
BLADE CLIPPER SURG (BLADE) ×3 IMPLANT
BLADE STERNUM SYSTEM 6 (BLADE) ×3 IMPLANT
BNDG ELASTIC 4X5.8 VLCR STR LF (GAUZE/BANDAGES/DRESSINGS) ×3 IMPLANT
BNDG ELASTIC 6X5.8 VLCR STR LF (GAUZE/BANDAGES/DRESSINGS) ×3 IMPLANT
BNDG GAUZE ELAST 4 BULKY (GAUZE/BANDAGES/DRESSINGS) ×3 IMPLANT
CANISTER SUCT 3000ML PPV (MISCELLANEOUS) ×3 IMPLANT
CATH CPB KIT HENDRICKSON (MISCELLANEOUS) ×3 IMPLANT
CATH ROBINSON RED A/P 18FR (CATHETERS) ×15 IMPLANT
CLIP RETRACTION 3.0MM CORONARY (MISCELLANEOUS) ×3 IMPLANT
CLIP VESOCCLUDE SM WIDE 24/CT (CLIP) ×9 IMPLANT
CONN ST 1/4X3/8  BEN (MISCELLANEOUS) ×1
CONN ST 1/4X3/8 BEN (MISCELLANEOUS) ×2 IMPLANT
CONTAINER PROTECT SURGISLUSH (MISCELLANEOUS) ×3 IMPLANT
DERMABOND ADVANCED (GAUZE/BANDAGES/DRESSINGS) ×1
DERMABOND ADVANCED .7 DNX12 (GAUZE/BANDAGES/DRESSINGS) ×2 IMPLANT
DRAIN CHANNEL 28F RND 3/8 FF (WOUND CARE) ×9 IMPLANT
DRAPE CARDIOVASCULAR INCISE (DRAPES) ×1
DRAPE EXTREMITY T 121X128X90 (DISPOSABLE) ×3 IMPLANT
DRAPE SRG 135X102X78XABS (DRAPES) ×2 IMPLANT
DRAPE WARM FLUID 44X44 (DRAPES) ×3 IMPLANT
DRSG AQUACEL AG ADV 3.5X14 (GAUZE/BANDAGES/DRESSINGS) ×3 IMPLANT
ELECT CAUTERY BLADE 6.4 (BLADE) ×3 IMPLANT
ELECT REM PT RETURN 9FT ADLT (ELECTROSURGICAL) ×6
ELECTRODE REM PT RTRN 9FT ADLT (ELECTROSURGICAL) ×4 IMPLANT
FELT TEFLON 1X6 (MISCELLANEOUS) ×3 IMPLANT
GAUZE SPONGE 4X4 12PLY STRL (GAUZE/BANDAGES/DRESSINGS) ×6 IMPLANT
GLOVE NEODERM STRL 7.5 LF PF (GLOVE) ×6 IMPLANT
GLOVE SURG MICRO LTX SZ6 (GLOVE) ×9 IMPLANT
GLOVE SURG MICRO LTX SZ6.5 (GLOVE) ×3 IMPLANT
GLOVE SURG NEODERM 7.5  LF PF (GLOVE) ×3
GOWN STRL REUS W/ TWL LRG LVL3 (GOWN DISPOSABLE) ×18 IMPLANT
GOWN STRL REUS W/TWL LRG LVL3 (GOWN DISPOSABLE) ×9
HEMOSTAT POWDER SURGIFOAM 1G (HEMOSTASIS) IMPLANT
INSERT FOGARTY XLG (MISCELLANEOUS) ×3 IMPLANT
INSERT SUTURE HOLDER (MISCELLANEOUS) ×3 IMPLANT
KIT APPLICATOR RATIO 11:1 (KITS) ×3 IMPLANT
KIT BASIN OR (CUSTOM PROCEDURE TRAY) ×3 IMPLANT
KIT SUCTION CATH 14FR (SUCTIONS) ×3 IMPLANT
KIT TURNOVER KIT B (KITS) ×3 IMPLANT
NEEDLE 18GX1X1/2 (RX/OR ONLY) (NEEDLE) ×3 IMPLANT
NS IRRIG 1000ML POUR BTL (IV SOLUTION) ×15 IMPLANT
PACK E OPEN HEART (SUTURE) ×3 IMPLANT
PACK OPEN HEART (CUSTOM PROCEDURE TRAY) ×3 IMPLANT
PACK PLATELET PROCEDURE 60 (MISCELLANEOUS) ×3 IMPLANT
PACK SPY-PHI (KITS) ×3 IMPLANT
PAD ARMBOARD 7.5X6 YLW CONV (MISCELLANEOUS) ×6 IMPLANT
PAD ELECT DEFIB RADIOL ZOLL (MISCELLANEOUS) ×3 IMPLANT
PENCIL BUTTON HOLSTER BLD 10FT (ELECTRODE) ×3 IMPLANT
POSITIONER HEAD DONUT 9IN (MISCELLANEOUS) ×3 IMPLANT
POWDER SURGICEL 3.0 GRAM (HEMOSTASIS) ×3 IMPLANT
PUNCH AORTIC ROTATE 4.5MM 8IN (MISCELLANEOUS) ×3 IMPLANT
SEALANT SURG COSEAL 8ML (VASCULAR PRODUCTS) ×3 IMPLANT
SET MPS 3-ND DEL (MISCELLANEOUS) ×3 IMPLANT
SHEARS HARMONIC 9CM CVD (BLADE) ×3 IMPLANT
SUPPORT HEART JANKE-BARRON (MISCELLANEOUS) ×3 IMPLANT
SUT BONE WAX W31G (SUTURE) ×3 IMPLANT
SUT ETHIBOND X763 2 0 SH 1 (SUTURE) ×3 IMPLANT
SUT MNCRL AB 3-0 PS2 18 (SUTURE) ×12 IMPLANT
SUT PDS AB 1 CTX 36 (SUTURE) ×6 IMPLANT
SUT PROLENE 3 0 SH DA (SUTURE) ×6 IMPLANT
SUT PROLENE 5 0 C 1 36 (SUTURE) IMPLANT
SUT PROLENE 6 0 C 1 30 (SUTURE) ×18 IMPLANT
SUT PROLENE 8 0 BV175 6 (SUTURE) ×3 IMPLANT
SUT PROLENE BLUE 7 0 (SUTURE) ×3 IMPLANT
SUT SILK  1 MH (SUTURE) ×1
SUT SILK 1 MH (SUTURE) ×2 IMPLANT
SUT SILK 2 0 SH CR/8 (SUTURE) ×6 IMPLANT
SUT SILK 3 0 SH CR/8 (SUTURE) ×3 IMPLANT
SUT STEEL 6MS V (SUTURE) ×3 IMPLANT
SUT STEEL SZ 6 DBL 3X14 BALL (SUTURE) ×3 IMPLANT
SUT VIC AB 2-0 CT1 27 (SUTURE) ×2
SUT VIC AB 2-0 CT1 TAPERPNT 27 (SUTURE) ×4 IMPLANT
SUT VIC AB 2-0 CTX 27 (SUTURE) IMPLANT
SUT VIC AB 3-0 X1 27 (SUTURE) ×3 IMPLANT
SYR 10ML LL (SYRINGE) IMPLANT
SYR 30ML LL (SYRINGE) ×3 IMPLANT
SYR 3ML LL SCALE MARK (SYRINGE) ×3 IMPLANT
SYSTEM SAHARA CHEST DRAIN ATS (WOUND CARE) ×3 IMPLANT
TAPE CLOTH SURG 4X10 WHT LF (GAUZE/BANDAGES/DRESSINGS) ×3 IMPLANT
TAPE PAPER 2X10 WHT MICROPORE (GAUZE/BANDAGES/DRESSINGS) ×3 IMPLANT
TIP DUAL SPRAY TOPICAL (TIP) ×6 IMPLANT
TOWEL GREEN STERILE (TOWEL DISPOSABLE) ×3 IMPLANT
TOWEL GREEN STERILE FF (TOWEL DISPOSABLE) ×3 IMPLANT
TRAY FOLEY SLVR 16FR TEMP STAT (SET/KITS/TRAYS/PACK) ×3 IMPLANT
UNDERPAD 30X36 HEAVY ABSORB (UNDERPADS AND DIAPERS) ×3 IMPLANT
WATER STERILE IRR 1000ML POUR (IV SOLUTION) ×6 IMPLANT

## 2020-08-24 NOTE — Anesthesia Procedure Notes (Signed)
Central Venous Catheter Insertion Performed by: Suzette Battiest, MD, anesthesiologist Start/End5/17/2022 6:55 AM, 08/24/2020 7:10 AM Patient location: Pre-op. Preanesthetic checklist: patient identified, IV checked, site marked, risks and benefits discussed, surgical consent, monitors and equipment checked, pre-op evaluation, timeout performed and anesthesia consent Position: Trendelenburg Lidocaine 1% used for infiltration and patient sedated Hand hygiene performed , maximum sterile barriers used  and Seldinger technique used Catheter size: 9 Fr Total catheter length 10. Central line and PA cath was placed.MAC introducer Swan type:thermodilution PA Cath depth:50 Procedure performed using ultrasound guided technique. Ultrasound Notes:anatomy identified, needle tip was noted to be adjacent to the nerve/plexus identified, no ultrasound evidence of intravascular and/or intraneural injection and image(s) printed for medical record Attempts: 1 Following insertion, line sutured, dressing applied and Biopatch. Post procedure assessment: blood return through all ports, free fluid flow and no air  Patient tolerated the procedure well with no immediate complications.

## 2020-08-24 NOTE — H&P (Signed)
History and Physical Interval Note:  08/24/2020 7:13 AM  Tyler Kim  has presented today for surgery, with the diagnosis of CAD.  The various methods of treatment have been discussed with the patient and family. After consideration of risks, benefits and other options for treatment, the patient has consented to  Procedure(s): CORONARY ARTERY BYPASS GRAFTING (CABG) (N/A) TRANSESOPHAGEAL ECHOCARDIOGRAM (TEE) (N/A) INDOCYANINE GREEN FLUORESCENCE IMAGING (ICG) (N/A) as a surgical intervention.  The patient's history has been reviewed, patient examined, no change in status, stable for surgery.  I have reviewed the patient's chart and labs.  Questions were answered to the patient's satisfaction.     Linden Dolin

## 2020-08-24 NOTE — Anesthesia Procedure Notes (Signed)
Arterial Line Insertion Start/End5/17/2022 7:15 AM Performed by: Lonia Mad, CRNA, CRNA  Patient location: Pre-op. Preanesthetic checklist: patient identified, IV checked, site marked, risks and benefits discussed, surgical consent, monitors and equipment checked, pre-op evaluation, timeout performed and anesthesia consent Lidocaine 1% used for infiltration and patient sedated Left, radial was placed Catheter size: 20 G Hand hygiene performed  and maximum sterile barriers used   Attempts: 2 Procedure performed without using ultrasound guided technique. Following insertion, dressing applied and Biopatch. Post procedure assessment: normal  Patient tolerated the procedure well with no immediate complications.

## 2020-08-24 NOTE — Procedures (Signed)
Extubation Procedure Note  Patient Details:   Name: Tyler Kim DOB: Sep 04, 1955 MRN: [id number]   Airway Documentation:    Vent end date: 08/24/20 Vent end time: 1705   Evaluation  O2 sats: stable throughout Complications: No apparent complications Patient did tolerate procedure well. Bilateral Breath Sounds: Clear,Diminished   Yes   Positive cuff leak noted. NIF - 26, VC 750 mL.  Patient extubated and placed on [place] 3L with humidity, no stridor noted. Patient able to reach 325 mL using the incentive spirometer.  Tyler Kim 08/24/2020, 5:18 PM

## 2020-08-24 NOTE — Transfer of Care (Signed)
Immediate Anesthesia Transfer of Care Note  Patient: Tyler Kim  Procedure(s) Performed: CORONARY ARTERY BYPASS GRAFTING (CABG) X3 USING BILATERAL MAMMARY ARTERIES AND RIGHT RADIAL ARTERY (N/A Chest) TRANSESOPHAGEAL ECHOCARDIOGRAM (TEE) (N/A ) INDOCYANINE GREEN FLUORESCENCE IMAGING (ICG) (N/A )  Patient Location: ICU  Anesthesia Type:General  Level of Consciousness: sedated and Patient remains intubated per anesthesia plan  Airway & Oxygen Therapy: Patient remains intubated per anesthesia plan and Patient placed on Ventilator (see vital sign flow sheet for setting)  Post-op Assessment: Report given to RN and Post -op Vital signs reviewed and stable  Post vital signs: Reviewed and stable  Last Vitals:  Vitals Value Taken Time  BP 103/53 08/24/20 1245  Temp 35.7 C 08/24/20 1250  Pulse 80 08/24/20 1250  Resp 12 08/24/20 1250  SpO2 100 % 08/24/20 1250  Vitals shown include unvalidated device data.  Last Pain:  Vitals:   08/24/20 0535  TempSrc: Oral  PainSc:          Complications: No complications documented.

## 2020-08-24 NOTE — Brief Op Note (Addendum)
08/18/2020 - 08/24/2020  1:17 PM  PATIENT:  Tyler Kim  65 y.o. male  PRE-OPERATIVE DIAGNOSIS:  coronary artery disease  POST-OPERATIVE DIAGNOSIS:  coronary artery disease  PROCEDURE:  Procedure(s): CORONARY ARTERY BYPASS GRAFTING (CABG) X3 USING BILATERAL MAMMARY ARTERIES AND RIGHT RADIAL ARTERY (N/A) TRANSESOPHAGEAL ECHOCARDIOGRAM (TEE) (N/A) INDOCYANINE GREEN FLUORESCENCE IMAGING (ICG) (N/A) [place]-L[place] RIGHT RADIAL -OM RIMA-PD  SURGEON:  Surgeon(s) and Role:    * Linden Dolin, MD - Primary  PHYSICIAN ASSISTANT: WAYNE GOLD PA-C  ASSISTANTS: STAFF   ANESTHESIA:   general  EBL:  585 mL   BLOOD ADMINISTERED:none  DRAINS: MEDIASTINAL AND PLEURAL CHEST DRAINS   LOCAL MEDICATIONS USED:  NONE  SPECIMEN:  No Specimen  DISPOSITION OF SPECIMEN:  N/A  COUNTS:  YES  TOURNIQUET:  * No tourniquets in log *  DICTATION: .Dragon Dictation  PLAN OF CARE: Admit to inpatient   PATIENT DISPOSITION:  ICU - intubated and hemodynamically stable.   Delay start of Pharmacological VTE agent (>24hrs) due to surgical blood loss or risk of bleeding: yes  COMPLICATIONS: NO KNOWN  Agree with documentation. Tyler Kim Z. Vickey Sages, MD [phone]

## 2020-08-24 NOTE — Op Note (Signed)
CARDIOTHORACIC SURGERY OPERATIVE NOTE  Date of Procedure: 08/24/2020  Preoperative Diagnosis: Severe 3-vessel Coronary Artery Disease  Postoperative Diagnosis: Same  Procedure:    Coronary Artery Bypass Grafting x 3  Left Internal Mammary Artery to Distal Left Anterior Descending Coronary Artery; pedicled right internal mammary artery graft to  Posterior Descending Coronary Artery; right radial artery graft to first obtuse Marginal Branch of Left Circumflex Coronary Artery Open right radial artery harvest  Surgeon: B.  Lorayne Marek, MD  Assistant: Webb Laws, PA-C  Anesthesia: General  Operative Findings:  Mildly reduced left ventricular systolic function  Good quality internal mammary artery conduits  Good quality right radial artery conduit  Good quality target vessels for grafting    BRIEF CLINICAL NOTE AND INDICATIONS FOR SURGERY  65 year old man presented on 08/18/2020 with a couple weeks history of nausea and chest pressure.  This was relieved with belching.  His symptoms progressed to the point where he presented the emergency department on the above listed day and was found to have elevated troponin.  Patient was transferred to [facility] and underwent left heart catheterization which demonstrated severe multivessel coronary artery disease and depressed LV function.  The patient has been medically stabilized and thoroughly evaluated for CABG.  Is taken the operating today for the procedure.   DETAILS OF THE OPERATIVE PROCEDURE  Preparation:  The patient is brought to the operating room on the above mentioned date and central monitoring was established by the anesthesia team including placement of Swan-Ganz catheter and radial arterial line. The patient is placed in the supine position on the operating table.  Intravenous antibiotics are administered. General endotracheal anesthesia is induced uneventfully. A Foley catheter is placed.  Baseline transesophageal  echocardiogram was performed.  Findings were notable for mildly reduced LV function and a very tiny PFO.  The patient's chest, abdomen, both groins, the right upper extremity, and both lower extremities are prepared and draped in a sterile manner. A time out procedure is performed.   Surgical Approach and Conduit Harvest:  Attention was first turned to the right forearm where an incision was made overlying the palpable radial pulse.  Open radial artery harvesting is performed with a harmonic scalpel.  Once the radial artery is harvested the wound is closed in layers and the arm is tucked at the right side.  A median sternotomy incision was performed and the left internal mammary artery is dissected from the chest wall and prepared for bypass grafting. The left internal mammary artery is notably good quality conduit.  Next, the right internal mammary artery is pedicle mobilized in standard fashion from the right chest wall.  Prior to dividing the pedicle distally full dose heparin is given intravenously.  Following systemic heparinization, the internal mammary artery conduits were transected distally noted to have excellent flow.  Both were treated with a solution of papaverine   Extracorporeal Cardiopulmonary Bypass and Myocardial Protection:  The pericardium is opened. The ascending aorta is nondiseased in appearance. The ascending aorta and the right atrium are cannulated for cardiopulmonary bypass.  Adequate heparinization is verified.     The entire pre-bypass portion of the operation was notable for stable hemodynamics.  Cardiopulmonary bypass was begun and the surface of the heart is inspected. Distal target vessels are selected for coronary artery bypass grafting. A cardioplegia cannula is placed in the ascending aorta.   The patient is allowed to cool passively to 35C systemic temperature.  The aortic cross clamp is applied and cold  blood cardioplegia is delivered initially in an antegrade  fashion through the aortic root. .  Iced saline slush is applied for topical hypothermia.  The initial cardioplegic arrest is rapid with early diastolic arrest.  Repeat doses of cardioplegia are administered intermittently throughout the entire cross clamp portion of the operation through the aortic root  and through subsequently placed  grafts in order to maintain completely flat electrocardiogram.   Coronary Artery Bypass Grafting:   The  posterior descending branch of the right coronary artery was grafted using the pedicled right internal mammary artery graft in an end-to-side fashion.  At the site of distal anastomosis the target vessel was good quality and measured approximately 1.5 mm in diameter. Anastomotic patency and runoff was confirmed with indocyanine green fluorescence imaging (SPY).  The first obtuse marginal branch of the left circumflex coronary artery was grafted using the right radial artery graft in an end-to-side fashion.  At the site of distal anastomosis the target vessel was good quality and measured approximately 1.5 mm in diameter.    The distal left anterior coronary artery was grafted with the left internal mammary artery in an end-to-side fashion.  At the site of distal anastomosis the target vessel was good quality and measured approximately 1.5 mm in diameter. Anastomotic patency and runoff was confirmed with indocyanine green fluorescence imaging (SPY).  The proximal radial artery graft anastomosis was placed directly to the ascending aorta prior to removal of the aortic cross clamp.  De-airing procedures were performed and the aortic cross-clamp was removed. Procedure Completion:  All proximal and distal coronary anastomoses were inspected for hemostasis and appropriate graft orientation. Epicardial pacing wires are fixed to the right ventricular outflow tract and to the right atrial appendage. The patient is rewarmed to 37C temperature. The patient is weaned and  disconnected from cardiopulmonary bypass.  The patient's rhythm at separation from bypass was sinus bradycardia.  The patient was weaned from cardiopulmonary bypass with low-dose inotropic support. Followup transesophageal echocardiogram performed after separation from bypass revealed no changes from the preoperative exam.  The aortic and venous cannula were removed uneventfully. Protamine was administered to reverse the anticoagulation. The mediastinum and pleural space were inspected for hemostasis and irrigated with saline solution. The mediastinum and both pleural spaces were drained using fluted chest tubes placed through separate stab incisions inferiorly.  The soft tissues anterior to the aorta were reapproximated loosely. The sternum is closed with double strength sternal wire. The soft tissues anterior to the sternum were closed in multiple layers and the skin is closed with a running subcuticular skin closure.  The post-bypass portion of the operation was notable for stable rhythm and hemodynamics.  No blood products were administered during the operation.   Disposition:  The patient tolerated the procedure well and is transported to the surgical intensive care in stable condition. There are no intraoperative complications. All sponge instrument and needle counts are verified correct at completion of the operation.    Brantley Fling, MD 08/24/2020 3:21 PM

## 2020-08-24 NOTE — Anesthesia Procedure Notes (Signed)
Procedure Name: Intubation Date/Time: 08/24/2020 8:10 AM Performed by: Trinna Post., CRNA Pre-anesthesia Checklist: Patient identified, Emergency Drugs available, Suction available, Patient being monitored and Timeout performed Patient Re-evaluated:Patient Re-evaluated prior to induction Oxygen Delivery Method: Circle system utilized Preoxygenation: Pre-oxygenation with 100% oxygen Induction Type: IV induction Ventilation: Mask ventilation without difficulty Laryngoscope Size: Glidescope and 4 Grade View: Grade I Tube type: Oral Tube size: 8.0 mm Number of attempts: 1 Airway Equipment and Method: Rigid stylet and Video-laryngoscopy Placement Confirmation: ETT inserted through vocal cords under direct vision,  positive ETCO2 and breath sounds checked- equal and bilateral Secured at: 23 cm Tube secured with: Tape Dental Injury: Teeth and Oropharynx as per pre-operative assessment  Comments: DL x1 by CRNA with MAC 4 with no view, decided to move to glidescope. 8.0 ETT passed without difficulty

## 2020-08-24 NOTE — Anesthesia Procedure Notes (Signed)
Central Venous Catheter Insertion Performed by: Marcene Duos, MD, anesthesiologist Start/End5/17/2022 6:55 AM, 08/24/2020 7:10 AM Patient location: Pre-op. Preanesthetic checklist: patient identified, IV checked, site marked, risks and benefits discussed, surgical consent, monitors and equipment checked, pre-op evaluation, timeout performed and anesthesia consent Hand hygiene performed  and maximum sterile barriers used  PA cath was placed.Swan type:thermodilution PA Cath depth:50 Procedure performed without using ultrasound guided technique. Attempts: 1 Patient tolerated the procedure well with no immediate complications.

## 2020-08-24 NOTE — Progress Notes (Signed)
ABG sent to lab °

## 2020-08-24 NOTE — Progress Notes (Signed)
Patient ID: Tyler Kim, male   DOB: [birth year], 65 y.o.   MRN: [id number]  [facility] Evening Rounds:   Hemodynamically stable  CI = 3.7  Extubated  Urine output good  CT output low  CBC    Component Value Date/Time   WBC 15.9 (H) 08/24/2020 1808   RBC 3.14 (L) 08/24/2020 1808   HGB 9.2 (L) 08/24/2020 1810   HCT 27.0 (L) 08/24/2020 1810   PLT 167 08/24/2020 1808   MCV 94.6 08/24/2020 1808   MCH 31.2 08/24/2020 1808   MCHC 33.0 08/24/2020 1808   RDW 13.2 08/24/2020 1808   LYMPHSABS 1.1 08/18/2020 1714   MONOABS 1.0 08/18/2020 1714   EOSABS 0.0 08/18/2020 1714   BASOSABS 0.0 08/18/2020 1714     BMET    Component Value Date/Time   NA 140 08/24/2020 1810   K 3.9 08/24/2020 1810   CL 107 08/24/2020 1808   CO2 21 (L) 08/24/2020 1808   GLUCOSE 160 (H) 08/24/2020 1808   BUN 16 08/24/2020 1808   CREATININE 1.09 08/24/2020 1808   CALCIUM 7.5 (L) 08/24/2020 1808   GFRNONAA >60 08/24/2020 1808     A/P:  Stable postop course. Continue current plans

## 2020-08-25 ENCOUNTER — Inpatient Hospital Stay (HOSPITAL_COMMUNITY): Payer: BC Managed Care – PPO

## 2020-08-25 ENCOUNTER — Encounter (HOSPITAL_COMMUNITY): Payer: Self-pay | Admitting: Cardiothoracic Surgery

## 2020-08-25 DIAGNOSIS — I214 Non-ST elevation (NSTEMI) myocardial infarction: Secondary | ICD-10-CM | POA: Diagnosis not present

## 2020-08-25 LAB — COMPREHENSIVE METABOLIC PANEL
ALT: 26 U/L (ref 0–44)
AST: 39 U/L (ref 15–41)
Albumin: 3.1 g/dL — ABNORMAL LOW (ref 3.5–5.0)
Alkaline Phosphatase: 40 U/L (ref 38–126)
Anion gap: 6 (ref 5–15)
BUN: 16 mg/dL (ref 8–23)
CO2: 22 mmol/L (ref 22–32)
Calcium: 7.6 mg/dL — ABNORMAL LOW (ref 8.9–10.3)
Chloride: 108 mmol/L (ref 98–111)
Creatinine, Ser: 1.08 mg/dL (ref 0.61–1.24)
GFR, Estimated: >60 mL/min (ref >60)
Glucose, Bld: 133 mg/dL — ABNORMAL HIGH (ref 70–99)
Potassium: 4.3 mmol/L (ref 3.5–5.1)
Sodium: 136 mmol/L (ref 135–145)
Total Bilirubin: 0.7 mg/dL (ref 0.3–1.2)
Total Protein: 5 g/dL — ABNORMAL LOW (ref 6.5–8.1)

## 2020-08-25 LAB — CBC
HCT: 26.7 % — ABNORMAL LOW (ref 39.0–52.0)
HCT: 25.6 % — ABNORMAL LOW (ref 39.0–52.0)
Hemoglobin: 8.8 g/dL — ABNORMAL LOW (ref 13.0–17.0)
Hemoglobin: 8.4 g/dL — ABNORMAL LOW (ref 13.0–17.0)
MCH: 31.2 pg (ref 26.0–34.0)
MCH: 31 pg (ref 26.0–34.0)
MCHC: 33 g/dL (ref 30.0–36.0)
MCHC: 32.8 g/dL (ref 30.0–36.0)
MCV: 94.7 fL (ref 80.0–100.0)
MCV: 94.5 fL (ref 80.0–100.0)
Platelets: 159 10*3/uL (ref 150–400)
Platelets: 141 10*3/uL — ABNORMAL LOW (ref 150–400)
RBC: 2.82 MIL/uL — ABNORMAL LOW (ref 4.22–5.81)
RBC: 2.71 MIL/uL — ABNORMAL LOW (ref 4.22–5.81)
RDW: 13.2 % (ref 11.5–15.5)
RDW: 13.6 % (ref 11.5–15.5)
WBC: 18.6 10*3/uL — ABNORMAL HIGH (ref 4.0–10.5)
WBC: 12.4 10*3/uL — ABNORMAL HIGH (ref 4.0–10.5)
nRBC: 0 % (ref 0.0–0.2)
nRBC: 0 % (ref 0.0–0.2)

## 2020-08-25 LAB — GLUCOSE, CAPILLARY
Glucose-Capillary: 107 mg/dL — ABNORMAL HIGH (ref 70–99)
Glucose-Capillary: 111 mg/dL — ABNORMAL HIGH (ref 70–99)
Glucose-Capillary: 116 mg/dL — ABNORMAL HIGH (ref 70–99)
Glucose-Capillary: 116 mg/dL — ABNORMAL HIGH (ref 70–99)
Glucose-Capillary: 149 mg/dL — ABNORMAL HIGH (ref 70–99)
Glucose-Capillary: 92 mg/dL (ref 70–99)

## 2020-08-25 LAB — MAGNESIUM
Magnesium: 2.5 mg/dL — ABNORMAL HIGH (ref 1.7–2.4)
Magnesium: 2.5 mg/dL — ABNORMAL HIGH (ref 1.7–2.4)

## 2020-08-25 LAB — BASIC METABOLIC PANEL
Anion gap: 7 (ref 5–15)
BUN: 17 mg/dL (ref 8–23)
CO2: 22 mmol/L (ref 22–32)
Calcium: 7.8 mg/dL — ABNORMAL LOW (ref 8.9–10.3)
Chloride: 106 mmol/L (ref 98–111)
Creatinine, Ser: 1.19 mg/dL (ref 0.61–1.24)
GFR, Estimated: >60 mL/min (ref >60)
Glucose, Bld: 105 mg/dL — ABNORMAL HIGH (ref 70–99)
Potassium: 3.9 mmol/L (ref 3.5–5.1)
Sodium: 135 mmol/L (ref 135–145)

## 2020-08-25 MED ORDER — THIAMINE HCL 100 MG/ML IJ SOLN
Freq: Once | INTRAVENOUS | Status: AC
  Filled 2020-08-25: qty 1000

## 2020-08-25 MED ORDER — COLCHICINE 0.3 MG HALF TABLET
0.3000 mg | ORAL_TABLET | Freq: Two times a day (BID) | ORAL | Status: DC
  Administered 2020-08-25 – 2020-08-26 (×4): 0.3 mg via ORAL
  Filled 2020-08-25 (×5): qty 1

## 2020-08-25 MED ORDER — ISOSORBIDE DINITRATE 10 MG PO TABS
5.0000 mg | ORAL_TABLET | Freq: Three times a day (TID) | ORAL | Status: DC
  Administered 2020-08-25 – 2020-08-30 (×15): 5 mg via ORAL
  Filled 2020-08-25 (×15): qty 1

## 2020-08-25 MED ORDER — LEVALBUTEROL HCL 0.63 MG/3ML IN NEBU: 0.6300 mg | INHALATION_SOLUTION | Freq: Four times a day (QID) | RESPIRATORY_TRACT | Status: DC | PRN

## 2020-08-25 MED ORDER — KETOROLAC TROMETHAMINE 15 MG/ML IJ SOLN
7.5000 mg | Freq: Four times a day (QID) | INTRAMUSCULAR | Status: AC
  Administered 2020-08-25 – 2020-08-26 (×5): 7.5 mg via INTRAVENOUS
  Filled 2020-08-25 (×5): qty 1

## 2020-08-25 NOTE — Progress Notes (Signed)
      [address]       [place] [address]             [phone]      POD # 1 CABG x 3  Resting comfortably  BP 135/61   Pulse 88   Temp 98.7 F (37.1 C) (Oral)   Resp (!) 26   Ht 5\' 10"  (1.778 m)   Wt 75 kg   SpO2 98%   BMI 23.72 kg/m   Intake/Output Summary (Last 24 hours) at 08/25/2020 1830 Last data filed at 08/25/2020 1400 Gross per 24 hour  Intake 2975.52 ml  Output 1105 ml  Net 1870.52 ml   K= 3.9, creatinine 1.19 Hct= 25  Doing well POD # 1  Neven Fina C. 08/27/2020, MD [facility] [phone]

## 2020-08-25 NOTE — Discharge Instructions (Signed)
[facility] office number [phone]   Coronary Artery Bypass Grafting, Care After This sheet gives you information about how to care for yourself after your procedure. Your doctor may also give you more specific instructions. If you have problems or questions, call your doctor. What can I expect after the procedure? After the procedure, it is common to:  Feel sick to your stomach (nauseous).  Not want to eat as much as normal (lack of appetite).  Have trouble pooping (constipation).  Have weakness and tiredness (fatigue).  Feel sad (depressed) or grouchy (irritable).  Have pain or discomfort around the cuts from surgery (incisions). Follow these instructions at home: Medicines  Take over-the-counter and prescription medicines only as told by your doctor. Do not stop taking medicines or start any new medicines unless your doctor says it is okay.  If you were prescribed an antibiotic medicine, take it as told by your doctor. Do not stop taking the antibiotic even if you start to feel better. Incision care  Follow instructions from your doctor about how to take care of your cuts from surgery. Make sure you: ? Wash your hands with soap and water before and after you change your bandage (dressing). If you cannot use soap and water, use hand sanitizer. ? Change your bandage as told by your doctor. ? Leave stitches (sutures), skin glue, or skin tape (adhesive) strips in place. They may need to stay in place for 2 weeks or longer. If tape strips get loose and curl up, you may trim the loose edges. Do not remove tape strips completely unless your doctor says it is okay.  Make sure the surgery cuts are clean, dry, and protected.  Check your cut areas every day for signs of infection. Check for: ? More redness, swelling, or pain. ? More fluid or blood. ? Warmth. ? Pus or a bad smell.  If cuts were made in your legs: ? Avoid crossing your legs. ? Avoid sitting for long periods of time.  Change positions every 30 minutes. ? Raise (elevate) your legs when you are sitting.   Bathing  Do not take baths, swim, or use a hot tub until your doctor says it is okay.  You may shower. Pat the surgery cuts dry. Do not rub the cuts to dry.  Eating and drinking  Eat foods that are high in fiber, such as beans, nuts, whole grains, and raw fruits and vegetables. Any meats you eat should be lean cut. Avoid canned, processed, and fried foods. This can help prevent trouble pooping. This is also a part of a heart-healthy diet.  Drink enough fluid to keep your pee (urine) pale yellow.  Do not drink alcohol until you are fully recovered. Ask your doctor when it is safe to drink alcohol.   Activity  Rest and limit your activity as told by your doctor. You may be told to: ? Stop any activity right away if you have chest pain, shortness of breath, irregular heartbeats, or dizziness. Get help right away if you have any of these symptoms. ? Move around often for short periods or take short walks as told by your doctor. Slowly increase your activities. ? Avoid lifting, pushing, or pulling anything that is heavier than 10 lb (4.5 kg) for at least 6 weeks or as told by your doctor.  Do physical therapy or a cardiac rehab (cardiac rehabilitation) program as told by your doctor. ? Physical therapy involves doing exercises to maintain movement and build strength and   endurance. ? A cardiac rehab program includes:  Exercise training.  Education.  Counseling.  Do not drive until your doctor says it is okay.  Ask your doctor when you can go back to work.  Ask your doctor when you can be sexually active. General instructions  Do not drive or use heavy machinery while taking prescription pain medicine.  Do not use any products that contain nicotine or tobacco. These include cigarettes, e-cigarettes, and chewing tobacco. If you need help quitting, ask your doctor.  Take 2-3 deep breaths every few  hours during the day while you get better. This helps expand your lungs and prevent problems.  If you were given a device called an incentive spirometer, use it several times a day to practice deep breathing. Support your chest with a pillow or your arms when you take deep breaths or cough.  Wear compression stockings as told by your doctor.  Weigh yourself every day. This helps to see if your body is holding (retaining) fluid that may make your heart and lungs work harder.  Keep all follow-up visits as told by your doctor. This is important. Contact a doctor if:  You have more redness, swelling, or pain around any cut.  You have more fluid or blood coming from any cut.  Any cut feels warm to the touch.  You have pus or a bad smell coming from any cut.  You have a fever.  You have swelling in your ankles or legs.  You have pain in your legs.  You gain 2 lb (0.9 kg) or more a day.  You feel sick to your stomach or you throw up (vomit).  You have watery poop (diarrhea). Get help right away if:  You have chest pain that goes to your jaw or arms.  You are short of breath.  You have a fast or irregular heartbeat.  You notice a "clicking" in your breastbone (sternum) when you move.  You have any signs of a stroke. "BE FAST" is an easy way to remember the main warning signs: ? B - Balance. Signs are dizziness, sudden trouble walking, or loss of balance. ? E - Eyes. Signs are trouble seeing or a change in how you see. ? F - Face. Signs are sudden weakness or loss of feeling of the face, or the face or eyelid drooping on one side. ? A - Arms. Signs are weakness or loss of feeling in an arm. This happens suddenly and usually on one side of the body. ? S - Speech. Signs are sudden trouble speaking, slurred speech, or trouble understanding what people say. ? T - Time. Time to call emergency services. Write down what time symptoms started.  You have other signs of a stroke, such  as: ? A sudden, very bad headache with no known cause. ? Feeling sick to your stomach. ? Throwing up. ? Jerky movements you cannot control (seizure). These symptoms may be an emergency. Do not wait to see if the symptoms will go away. Get medical help right away. Call your local emergency services (911 in the [place] Do not drive yourself to the hospital. Summary  After the procedure, it is common to have pain or discomfort in the cuts from surgery (incisions).  Do not take baths, swim, or use a hot tub until your doctor says it is okay.  Slowly increase your activities. You may need physical therapy or cardiac rehab.  Weigh yourself every day. This helps to see if your  body is holding fluid. This information is not intended to replace advice given to you by your health care provider. Make sure you discuss any questions you have with your health care provider. Document Revised: 12/04/2017 Document Reviewed: 12/04/2017 Elsevier Patient Education  2021 [employer].

## 2020-08-25 NOTE — Discharge Summary (Signed)
Physician Discharge Summary  Patient ID: Tyler HaringStephen Kim MRN: [id number] DOB/AGE: 64/07/1955 64 y.o.  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 S/P CABG x 3 08/24/2020  . Smoker 08/19/2020  . NSTEMI (non-ST elevated myocardial infarction) (HCC) 08/18/2020    Discharged Condition: good History of present illness:  The patient is a 65 year old male with no significant past medical history.  He takes no medications and has never been hospitalized.  He does have a history of significant tobacco abuse 1 to 2 packs/day since age 65.  He is retired from [employer] and typically works around the house in the yard without significant difficulty.  He has been in his normal state of health until about 2 weeks ago at which time he had an episode of fullness/pressure in his upper chest and throat as well as pain that radiated down both arms.  It was described as severe.  It did resolve with belching and rest after a few minutes.  He then developed a series of similar episodes that were worsening over time.  On date of admission he [facility] experiencing some of the same symptoms but they persisted for over 2 hours.  On arrival to the emergency department his vital signs were unremarkable but troponin was measured at greater than 27,000 and EKG showed an evolving acute anterior infarct with associated T wave inversions.  These were felt to be consistent with a late presenting MI and he was admitted for further management by cardiology.  He was medically stabilized and subsequently underwent cardiac catheterization where he is found to have severe coronary artery disease.  Cardiothoracic surgical consultation was obtained with Dr. Vickey SagesAtkins who  recommended CABG.  Hospital course following medical stabilization and diagnostic evaluation he was taken the operating room on 08/24/2020 and underwent CABG x3 using bilateral mammary arteries and a right radial artery.  He tolerated the procedure well was taken to the surgical intensive care unit in stable condition  Postoperative hospital course:  He was weaned from the ventilator using standard postoperative surgical protocols.  He did require some inotropic support and atrial pacing initially.  This has been able to be weaned over time.  He is placed on dual antiplatelet therapy as well as high-dose statin.  Plans are for low-dose metoprolol to be added on 520 as blood pressure is allowing.  He has also developed postoperative atrial fibrillation with rapid ventricular response and is placed on intravenous amiodarone on 08/27/2020.  He has had some postoperative nausea and Reglan has been added to assist with gastric emptying resulting in resolution of this symptom. He had a few more episodes of atrial fibrillation and was given additional IV amiodarone boluses. His serum potassium was supplemented as required.  He is now maintaining sinus rhythm.  Oxygen has been weaned and he maintains good saturations on room air.  Renal function is noted to be improving.  Most recent BUN/creatinine on 08/30/2020 are 33/1.36 respectively.  He will require further diuresis as an outpatient.  Leukocytosis trend continues to improve.  Capillary blood glucose are well controlled.  Incisions are healing well without evidence of infection.  He is tolerating routine cardiac rehab moving about the unit well.  At the time of discharge the patient  is felt to be quite stable.   Consults: cardiology  Significant Diagnostic Studies:   EXAM: CHEST - 2 VIEW  COMPARISON:  Aug 27, 2020  FINDINGS: The small left apical pneumothorax is stable. No right-sided pneumothorax. The cardiomediastinal silhouette is stable. The  right lung is clear. There is probably a small right pleural effusion. A small left effusion with underlying atelectasis is identified. No other abnormalities.  IMPRESSION: 1. Stable small left apical pneumothorax. 2. Small bilateral pleural effusions, left greater than right, with underlying atelectasis.   Electronically Signed   By: Gerome Sam III M.D   On: 08/28/2020 08:39   Treatments:            Show:Clear all [x] Manual[x] Template[] Copied  Added by: [x] , MD   [] Hover for details  CARDIOTHORACIC SURGERY OPERATIVE NOTE  Date of Procedure:    08/24/2020  Preoperative Diagnosis:      Severe 3-vessel Coronary Artery Disease  Postoperative Diagnosis:    Same  Procedure:        Coronary Artery Bypass Grafting x 3             Left Internal Mammary Artery to Distal Left Anterior Descending Coronary Artery; pedicled right internal mammary artery graft to  Posterior Descending Coronary Artery; right radial artery graft to first obtuse Marginal Branch of Left Circumflex Coronary Artery Open right radial artery harvest  Surgeon:        B.  Linden Dolin, MD  Assistant:       , PA-C  Anesthesia:    General  Operative Findings: ? Mildly reduced left ventricular systolic function ? Good quality internal mammary artery conduits ? Good quality right radial artery conduit ? Good quality target vessels for grafting    BRIEF CLINICAL NOTE AND INDICATIONS FOR SURGERY  65 year old man presented on 08/18/2020 with a couple weeks history of nausea and chest pressure.  This was relieved with belching.  His symptoms progressed to the point where he presented the emergency department on the above listed day and was found to have elevated troponin.  Patient was transferred to [facility] and underwent left heart catheterization which demonstrated severe multivessel coronary artery disease and depressed LV function.  The  patient has been medically stabilized and thoroughly evaluated for CABG.  Is taken the operating today for the procedure.         Discharge Exam: Blood pressure 109/71, pulse 62, temperature 98 F (36.7 C), temperature source Oral, resp. rate 17, height 5\' 10"  (1.778 m), weight 74.7 kg, SpO2 98 %.   General appearance: alert, cooperative and no distress Heart: regular rate and rhythm Lungs: mildly dim in bases Abdomen: benign Extremities: + edema Wound: incis healing well, right arm N/V intact   Disposition: Discharge disposition: 01-Home or Self Care      Discharge Instructions    Amb Referral to Cardiac Rehabilitation   Complete by: As directed    Diagnosis:  CABG NSTEMI     CABG X ___: 3   After initial evaluation and assessments completed: Virtual Based Care may be provided alone or in conjunction with Phase 2 Cardiac Rehab based on patient barriers.: Yes   Discharge patient   Complete by: As directed    Discharge disposition: 01-Home or Self Care   Discharge patient date: 08/30/2020     Allergies as of 08/30/2020   No Known Allergies     Medication List    TAKE these medications   acetaminophen 325  MG tablet Commonly known as: TYLENOL Take 2 tablets (650 mg total) by mouth every 6 (six) hours as needed for mild pain.   amiodarone 200 MG tablet Commonly known as: PACERONE Take 2 tablets (400 mg total) by mouth 2 (two) times daily for 5 days. Then reduce the dose to 1 tablet (200mg ) twice daily.   aspirin 81 MG EC tablet Take 1 tablet (81 mg total) by mouth daily. Swallow whole.   atorvastatin 80 MG tablet Commonly known as: LIPITOR Take 1 tablet (80 mg total) by mouth daily.   cholecalciferol 25 MCG (1000 UNIT) tablet Commonly known as: VITAMIN D3 Take 1,000 Units by mouth daily.   clopidogrel 75 MG tablet Commonly known as: PLAVIX Take 1 tablet (75 mg total) by mouth daily.   Fish Oil 1000 MG Caps Take 1 capsule by mouth daily.   furosemide  40 MG tablet Commonly known as: LASIX Take 1 tablet (40 mg total) by mouth daily.   isosorbide dinitrate 5 MG tablet Commonly known as: ISORDIL Take 1 tablet (5 mg total) by mouth 3 (three) times daily.   metoprolol tartrate 25 MG tablet Commonly known as: LOPRESSOR Take 0.5 tablets (12.5 mg total) by mouth every 8 (eight) hours.   potassium chloride SA 20 MEQ tablet Commonly known as: KLOR-CON Take 1 tablet (20 mEq total) by mouth daily.   traMADol 50 MG tablet Commonly known as: ULTRAM Take 1 tablet (50 mg total) by mouth every 6 (six) hours as needed for moderate pain.   vitamin B-12 1000 MCG tablet Commonly known as: CYANOCOBALAMIN Take 1,000 mcg by mouth daily.   vitamin C 1000 MG tablet Take 1,000 mg by mouth daily.   vitamin E 180 MG (400 UNITS) capsule Generic drug: vitamin E Take 400 Units by mouth daily.       The patient has been discharged on:   1.Beta Blocker:  Yes [ x  ]                              No   [   ]                              If No, reason:  2.Ace Inhibitor/ARB: Yes [   ]                                     No  [ n   ]                                     If No, reason:labile BP  3.Statin:   Yes [ x  ]                  No  [   ]                  If No, reason:  4.Ecasa:  Yes  [x   ]                  No   [   ]                  If No, reason:  Signed:  PA-C 08/31/2020, 10:17 AM

## 2020-08-25 NOTE — Plan of Care (Signed)

## 2020-08-25 NOTE — Progress Notes (Signed)
Progress Note  Patient Name: Tyler HaringStephen Triggs Date of Encounter: 08/25/2020  [facility] Cardiologist: None   Subjective   Doing well with no specific complaints today.  Expected postoperative soreness.  Inpatient Medications    Scheduled Meds: . acetaminophen  1,000 mg Oral Q6H   Or  . acetaminophen (TYLENOL) oral liquid 160 mg/5 mL  1,000 mg Per Tube Q6H  . aspirin EC  325 mg Oral Daily   Or  . aspirin  324 mg Per Tube Daily  . atorvastatin  80 mg Oral Daily  . bisacodyl  10 mg Oral Daily   Or  . bisacodyl  10 mg Rectal Daily  . Chlorhexidine Gluconate Cloth  6 each Topical Daily  . colchicine  0.3 mg Oral BID  . docusate sodium  200 mg Oral Daily  . insulin aspart  0-24 Units Subcutaneous Q4H  . isosorbide dinitrate  5 mg Oral TID  . ketorolac  7.5 mg Intravenous Q6H  . [START ON 08/26/2020] pantoprazole  40 mg Oral Daily  . sodium chloride flush  10-40 mL Intracatheter Q12H  . sodium chloride flush  3 mL Intravenous Q12H   Continuous Infusions: . sodium chloride 20 mL/hr at 08/25/20 0600  . sodium chloride    . sodium chloride    .  ceFAZolin (ANCEF) IV 200 mL/hr at 08/25/20 0600  . electrolyte-A 75 mL/hr at 08/25/20 0600  . epinephrine 2 mcg/min (08/25/20 0600)  . banana bag IV 1000 mL    . lactated ringers    . lactated ringers    . lactated ringers 20 mL/hr at 08/25/20 0600  . phenylephrine (NEO-SYNEPHRINE) Adult infusion 18 mcg/min (08/25/20 0600)   PRN Meds: sodium chloride, lactated ringers, levalbuterol, metoprolol tartrate, morphine injection, ondansetron (ZOFRAN) IV, oxyCODONE, sodium chloride flush, sodium chloride flush, traMADol   Vital Signs    Vitals:   08/25/20 0530 08/25/20 0545 08/25/20 0600 08/25/20 0615  BP:      Pulse: 90 89 91 90  Resp: (!) 28 16 14  (!) 25  Temp: 97.9 F (36.6 C) 97.9 F (36.6 C) 97.9 F (36.6 C) 98.1 F (36.7 C)  TempSrc:      SpO2: 99% 98% 99% 96%  Weight:      Height:        Intake/Output Summary (Last  24 hours) at 08/25/2020 0947 Last data filed at 08/25/2020 0600 Gross per 24 hour  Intake 5757.06 ml  Output 3325 ml  Net 2432.06 ml   Last 3 Weights 08/25/2020 08/24/2020 08/23/2020  Weight (lbs) 165 lb 5.5 oz 153 lb 1.6 oz 154 lb 12.8 oz  Weight (kg) 75 kg 69.446 kg 70.217 kg      Telemetry    A paced 90 bpm - Personally Reviewed   Physical Exam  Alert, oriented, no distress GEN: No acute distress.   Neck: No JVD Cardiac: RRR, postop rub present Respiratory: Clear to auscultation bilaterally. GI: Soft, nontender, non-distended  MS:  Trace bilateral pretibial edema; No deformity. Neuro:  Nonfocal  Psych: Normal affect   Labs    High Sensitivity Troponin:   Recent Labs  Lab 08/18/20 1714 08/18/20 1914 08/19/20 0401  TROPONINIHS >27,000* >27,000* >27,000*      Chemistry Recent Labs  Lab 08/23/20 [id number] 08/24/20 0338 08/24/20 [id number] 08/24/20 1138 08/24/20 1215 08/24/20 1808 08/24/20 1810 08/25/20 0555  NA 137 137   < > 136   < > 137 140 136  K 3.8 3.8   < > 5.4*   < >  4.0 3.9 4.3  CL 109 107   < > 104  --  107  --  108  CO2 22 24  --   --   --  21*  --  22  GLUCOSE 99 108*   < > 107*  --  160*  --  133*  BUN 23 22   < > 18  --  16  --  16  CREATININE 1.16 1.20   < > 0.80  --  1.09  --  1.08  CALCIUM 8.6* 8.4*  --   --   --  7.5*  --  7.6*  PROT 6.0* 5.8*  --   --   --   --   --  5.0*  ALBUMIN 2.9* 2.9*  --   --   --   --   --  3.1*  AST 39 40  --   --   --   --   --  39  ALT 42 42  --   --   --   --   --  26  ALKPHOS 66 65  --   --   --   --   --  40  BILITOT 0.5 0.6  --   --   --   --   --  0.7  GFRNONAA >60 >60  --   --   --  >60  --  >60  ANIONGAP 6 6  --   --   --  9  --  6   < > = values in this interval not displayed.     Hematology Recent Labs  Lab 08/24/20 1301 08/24/20 1656 08/24/20 1808 08/24/20 1810 08/25/20 0555  WBC 18.1*  --  15.9*  --  18.6*  RBC 3.21*  --  3.14*  --  2.82*  HGB 10.2*  9.8*   < > 9.8* 9.2* 8.8*  HCT 30.0*  30.3*    < > 29.7* 27.0* 26.7*  MCV 94.4  --  94.6  --  94.7  MCH 30.5  --  31.2  --  31.2  MCHC 32.3  --  33.0  --  33.0  RDW 13.1  --  13.2  --  13.2  PLT 139*  --  167  --  159   < > = values in this interval not displayed.    BNP Recent Labs  Lab 08/21/20 0900  BNP 279.1*     DDimer No results for input(s): DDIMER in the last 168 hours.   Radiology    DG Chest Port 1 View  Result Date: 08/25/2020 CLINICAL DATA:  Status post cardiac surgery. EXAM: PORTABLE CHEST 1 VIEW COMPARISON:  Aug 24, 2020. FINDINGS: Stable cardiomediastinal silhouette. Bilateral chest tubes are noted. Small left apical pneumothorax is noted. Mild bibasilar subsegmental atelectasis is noted. Right internal jugular Swan-Ganz catheter is directed into right pulmonary artery. Endotracheal and nasogastric tubes have been removed. Bony thorax is unremarkable. IMPRESSION: Bilateral chest tubes are again noted. Small left apical pneumothorax is noted. Endotracheal and nasogastric tubes have been removed. Electronically Signed   By: Lupita Raider M.D.   On: 08/25/2020 08:25   DG Chest Port 1 View  Result Date: 08/24/2020 CLINICAL DATA:  Hypoxia EXAM: PORTABLE CHEST 1 VIEW COMPARISON:  Aug 18, 2020 FINDINGS: Endotracheal tube tip is 4.9 cm above the carina. Nasogastric tube tip and side port below the diaphragm. Swan-Ganz catheter tip is in the right main pulmonary artery. There is  a chest tube on each side as well as a mediastinal drain. Temporary pacemaker wires are attached to the right heart. No pneumothorax. There is mild medial left base atelectasis. Lungs elsewhere clear. Heart size and pulmonary vascularity are normal. No adenopathy. Status post median sternotomy with surgical clips present. There is aortic atherosclerosis. No bone lesions. IMPRESSION: Tube and catheter positions as described without evident pneumothorax. Medial left base atelectasis. Lungs otherwise clear. Heart size normal. Aortic Atherosclerosis  (ICD10-I70.0). Electronically Signed   By: Bretta Bang III M.D.   On: 08/24/2020 13:52   VAS US DOPPLER PRE CABG  Result Date: 08/23/2020 PREOPERATIVE VASCULAR EVALUATION Patient Name:  Tyler Kim  Date of Exam:   08/23/2020 Medical Rec #: [id number]      Accession #:    [id number] Date of Birth: 22-Sep-1955       Patient Gender: M Patient Age:   35Y Exam Location:  [facility] Procedure:      VAS US DOPPLER PRE CABG Referring Phys: [id number] BROADUS Z ATKINS --------------------------------------------------------------------------------  Indications:      Pre-CABG. Risk Factors:     Current smoker, prior MI. Comparison Study: No prior studies. Performing Technologist: Jean Rosenthal RDMS,RVT  Examination Guidelines: A complete evaluation includes B-mode imaging, spectral Doppler, color Doppler, and power Doppler as needed of all accessible portions of each vessel. Bilateral testing is considered an integral part of a complete examination. Limited examinations for reoccurring indications may be performed as noted.  Right Carotid Findings: +----------+--------+--------+--------+------------+--------+           PSV cm/sEDV cm/sStenosisDescribe    Comments +----------+--------+--------+--------+------------+--------+ CCA Prox  62      21                                   +----------+--------+--------+--------+------------+--------+ CCA Distal58      23              heterogenous         +----------+--------+--------+--------+------------+--------+ ICA Prox  47      19                                   +----------+--------+--------+--------+------------+--------+ ICA Distal89      32                                   +----------+--------+--------+--------+------------+--------+ ECA       62      15                                   +----------+--------+--------+--------+------------+--------+ Portions of this table do not appear on this page.  +----------+--------+-------+----------------+------------+           PSV cm/sEDV cmsDescribe        Arm Pressure +----------+--------+-------+----------------+------------+ Subclavian77             Multiphasic, WNL             +----------+--------+-------+----------------+------------+ +---------+--------+--+--------+--+---------+ VertebralPSV cm/s39EDV cm/s17Antegrade +---------+--------+--+--------+--+---------+ Left Carotid Findings: +----------+--------+--------+--------+------------+--------+           PSV cm/sEDV cm/sStenosisDescribe    Comments +----------+--------+--------+--------+------------+--------+ CCA Prox  69      29                                   +----------+--------+--------+--------+------------+--------+  CCA Distal62      25                                   +----------+--------+--------+--------+------------+--------+ ICA Prox  56      21              heterogenous         +----------+--------+--------+--------+------------+--------+ ICA Distal82      38                                   +----------+--------+--------+--------+------------+--------+ ECA       62      16                                   +----------+--------+--------+--------+------------+--------+ +----------+--------+--------+----------------+------------+ SubclavianPSV cm/sEDV cm/sDescribe        Arm Pressure +----------+--------+--------+----------------+------------+           89      14      Multiphasic, WNL             +----------+--------+--------+----------------+------------+ +---------+--------+--+--------+--+---------+ VertebralPSV cm/s53EDV cm/s23Antegrade +---------+--------+--+--------+--+---------+  ABI Findings: +--------+------------------+-----+---------+--------+ Right   Rt Pressure (mmHg)IndexWaveform Comment  +--------+------------------+-----+---------+--------+ Brachial120                    triphasic          +--------+------------------+-----+---------+--------+ PTA     144               1.20 triphasic         +--------+------------------+-----+---------+--------+ DP      132               1.10 triphasic         +--------+------------------+-----+---------+--------+ +--------+------------------+-----+---------+-------+ Left    Lt Pressure (mmHg)IndexWaveform Comment +--------+------------------+-----+---------+-------+ [id number]                    triphasic        +--------+------------------+-----+---------+-------+ PTA     135               1.12 triphasic        +--------+------------------+-----+---------+-------+ DP      139               1.16 biphasic         +--------+------------------+-----+---------+-------+  Right Doppler Findings: +--------+--------+-----+---------+--------+ Site    PressureIndexDoppler  Comments +--------+--------+-----+---------+--------+ [id number]          triphasic         +--------+--------+-----+---------+--------+ Radial               triphasic         +--------+--------+-----+---------+--------+ Ulnar                triphasic         +--------+--------+-----+---------+--------+  Left Doppler Findings: +--------+--------+-----+---------+--------+ Site    PressureIndexDoppler  Comments +--------+--------+-----+---------+--------+ [id number]          triphasic         +--------+--------+-----+---------+--------+ Radial               triphasic         +--------+--------+-----+---------+--------+ Ulnar                triphasic         +--------+--------+-----+---------+--------+  Summary: Right Carotid: The extracranial vessels were near-normal with only minimal wall                thickening or plaque. Left Carotid: The extracranial vessels were near-normal with only minimal wall               thickening or plaque. Vertebrals:  Bilateral vertebral arteries demonstrate antegrade flow. Subclavians:  Normal flow hemodynamics were seen in bilateral subclavian              arteries. Right ABI: Resting right ankle-brachial index is within normal range. No evidence of significant right lower extremity arterial disease. Left ABI: Resting left ankle-brachial index is within normal range. No evidence of significant left lower extremity arterial disease. Right Upper Extremity: Doppler waveforms remain within normal limits with right radial compression. Doppler waveforms remain within normal limits with right ulnar compression. Left Upper Extremity: Doppler waveforms remain within normal limits with left radial compression. Doppler waveforms remain within normal limits with left ulnar compression.  Electronically signed by Sherald Hess MD on 08/23/2020 at 5:06:44 PM.    Final      Patient Profile     65 y.o. male admitted 08/19/20 after experiencing digestive symptoms commencing at 9:30 AM. He ultimately presented to an urgent care center and was recommended to go to [facility] long hospital for further evaluation. At the time he was asymptomatic and ECG confirmed late presentation anterior MI>>cah with severe 3VD>>plan for CABG 08/24/20.   Assessment & Plan    1.  Acute MI, late presentation.  Found to have severe multivessel CAD and severe LV dysfunction with anteroapical akinesis consistent with anterior infarction.  Patient now postoperative day #1 for multivessel CABG.  Progressing well.  Plans noted to wean off inotropic therapy and mobilize today.  Heart rhythm is stable, currently with a pacing at 90 bpm. 2.  Paroxysmal atrial fibrillation: Atrial paced rhythm this morning with no A. fib noted.  Patient stable postoperative day #1, progression per surgical team.      For questions or updates, please contact [facility] Please consult [email or web address] for contact info under        Signed, Tonny Bollman, MD  08/25/2020, 9:47 AM

## 2020-08-25 NOTE — Progress Notes (Signed)
1 Day Post-Op Procedure(s) (LRB): CORONARY ARTERY BYPASS GRAFTING (CABG) X3 USING BILATERAL MAMMARY ARTERIES AND RIGHT RADIAL ARTERY (N/A) TRANSESOPHAGEAL ECHOCARDIOGRAM (TEE) (N/A) INDOCYANINE GREEN FLUORESCENCE IMAGING (ICG) (N/A) Subjective: Mild soreness Objective: Vital signs in last 24 hours: Temp:  [96.08 F (35.6 C)-99.5 F (37.5 C)] 98.1 F (36.7 C) (05/18 0615) Pulse Rate:  [80-91] 90 (05/18 0615) Cardiac Rhythm: Atrial paced (05/18 0400) Resp:  [12-29] 25 (05/18 0615) BP: (103-135)/(53-61) 135/61 (05/17 1707) SpO2:  [96 %-100 %] 96 % (05/18 0615) Arterial Line BP: (77-131)/(31-72) 108/58 (05/18 0615) FiO2 (%):  [40 %-50 %] 40 % (05/17 1630) Weight:  [75 kg] 75 kg (05/18 0500)  Hemodynamic parameters for last 24 hours: PAP: (11-25)/(-1-12) 25/9 CO:  [4 L/min-7.2 L/min] 6.1 L/min CI:  [2.1 L/min/m2-3.9 L/min/m2] 3.3 L/min/m2  Intake/Output from previous day: 05/17 0701 - 05/18 0700 In: 6107.1 [I.V.:4524.5; Blood:322; IV Piggyback:1260.6] Out: 3325 [Urine:2200; Blood:585; Chest Tube:540] Intake/Output this shift: No intake/output data recorded.  General appearance: alert and cooperative Neurologic: intact Heart: regular rate and rhythm, S1, S2 normal, no murmur, click, rub or gallop Lungs: clear to auscultation bilaterally Abdomen: soft, non-tender; bowel sounds normal; no masses,  no organomegaly Extremities: extremities normal, atraumatic, no cyanosis or edema Wound: c/d/i  Lab Results: Recent Labs    08/24/20 1808 08/24/20 1810 08/25/20 0555  WBC 15.9*  --  18.6*  HGB 9.8* 9.2* 8.8*  HCT 29.7* 27.0* 26.7*  PLT 167  --  159   BMET:  Recent Labs    08/24/20 1808 08/24/20 1810 08/25/20 0555  NA 137 140 136  K 4.0 3.9 4.3  CL 107  --  108  CO2 21*  --  22  GLUCOSE 160*  --  133*  BUN 16  --  16  CREATININE 1.09  --  1.08  CALCIUM 7.5*  --  7.6*    PT/INR:  Recent Labs    08/24/20 1301  LABPROT 15.4*  INR 1.2   ABG    Component Value  Date/Time   PHART 7.344 (L) 08/24/2020 1810   HCO3 20.0 08/24/2020 1810   TCO2 21 (L) 08/24/2020 1810   ACIDBASEDEF 5.0 (H) 08/24/2020 1810   O2SAT 96.0 08/24/2020 1810   CBG (last 3)  Recent Labs    08/24/20 2316 08/25/20 0404 08/25/20 0750  GLUCAP 181* 149* 116*    Assessment/Plan: S/P Procedure(s) (LRB): CORONARY ARTERY BYPASS GRAFTING (CABG) X3 USING BILATERAL MAMMARY ARTERIES AND RIGHT RADIAL ARTERY (N/A) TRANSESOPHAGEAL ECHOCARDIOGRAM (TEE) (N/A) INDOCYANINE GREEN FLUORESCENCE IMAGING (ICG) (N/A) Mobilize gentle resuscitation  oob to chair Wean drips   LOS: 7 days    Tyler Kim 08/25/2020

## 2020-08-25 NOTE — Anesthesia Postprocedure Evaluation (Signed)
Anesthesia Post Note  Patient: Tyler Kim  Procedure(s) Performed: CORONARY ARTERY BYPASS GRAFTING (CABG) X3 USING BILATERAL MAMMARY ARTERIES AND RIGHT RADIAL ARTERY (N/A Chest) TRANSESOPHAGEAL ECHOCARDIOGRAM (TEE) (N/A ) INDOCYANINE GREEN FLUORESCENCE IMAGING (ICG) (N/A )     Patient location during evaluation: SICU Anesthesia Type: General Level of consciousness: sedated Pain management: pain level controlled Vital Signs Assessment: post-procedure vital signs reviewed and stable Respiratory status: patient remains intubated per anesthesia plan Cardiovascular status: stable Postop Assessment: no apparent nausea or vomiting Anesthetic complications: no   No complications documented.  Last Vitals:  Vitals:   08/25/20 0600 08/25/20 0615  BP:    Pulse: 91 90  Resp: 14 (!) 25  Temp: 36.6 C 36.7 C  SpO2: 99% 96%    Last Pain:  Vitals:   08/25/20 0400  TempSrc:   PainSc: 0-No pain                 Kennieth Rad

## 2020-08-25 NOTE — Hospital Course (Addendum)
History of present illness:  The patient is a 65 year old male with no significant past medical history.  He takes no medications and has never been hospitalized.  He does have a history of significant tobacco abuse 1 to 2 packs/day since age 61.  He is retired from [employer] and typically works around the house in the yard without significant difficulty.  He has been in his normal state of health until about 2 weeks ago at which time he had an episode of fullness/pressure in his upper chest and throat as well as pain that radiated down both arms.  It was described as severe.  It did resolve with belching and rest after a few minutes.  He then developed a series of similar episodes that were worsening over time.  On date of admission he presented to [facility] emergency department experiencing some of the same symptoms but they persisted for over 2 hours.  On arrival to the emergency department his vital signs were unremarkable but troponin was measured at greater than 27,000 and EKG showed an evolving acute anterior infarct with associated T wave inversions.  These were felt to be consistent with a late presenting MI and he was admitted for further management by cardiology.  He was medically stabilized and subsequently underwent cardiac catheterization where he is found to have severe coronary artery disease.  Cardiothoracic surgical consultation was obtained with Dr. Vickey Sages who recommended CABG.  Hospital course following medical stabilization and diagnostic evaluation he was taken the operating room on 08/24/2020 and underwent CABG x3 using bilateral mammary arteries and a right radial artery.  He tolerated the procedure well was taken to the surgical intensive care unit in stable condition  Postoperative hospital course:  He was weaned from the ventilator using standard postoperative surgical protocols.  He did require some inotropic support and atrial pacing initially.  This has been able to be weaned over  time.  He is placed on dual antiplatelet therapy as well as high-dose statin.  Plans are for low-dose metoprolol to be added on 520 as blood pressure is allowing.  He has also developed postoperative atrial fibrillation with rapid ventricular response and is placed on intravenous amiodarone on 08/27/2020.  He has had some postoperative nausea and Reglan has been added to assist with gastric emptying resulting in resolution of this symptom. He had a few more episodes of atrial fibrillation and was given additional IV amiodarone boluses. His serum potassium was supplemented as required.  He is now maintaining sinus rhythm.  Oxygen has been weaned and he maintains good saturations on room air.  Renal function is noted to be improving.  Most recent BUN/creatinine on 08/30/2020 are 33/1.36 respectively.  He will require further diuresis as an outpatient.  Leukocytosis trend continues to improve.  Capillary blood glucose are well controlled.  Incisions are healing well without evidence of infection.  He is tolerating routine cardiac rehab moving about the unit well.  At the time of discharge the patient is felt to be quite stable.

## 2020-08-26 ENCOUNTER — Inpatient Hospital Stay (HOSPITAL_COMMUNITY): Payer: BC Managed Care – PPO

## 2020-08-26 LAB — CBC
HCT: 25.2 % — ABNORMAL LOW (ref 39.0–52.0)
Hemoglobin: 8.1 g/dL — ABNORMAL LOW (ref 13.0–17.0)
MCH: 30.6 pg (ref 26.0–34.0)
MCHC: 32.1 g/dL (ref 30.0–36.0)
MCV: 95.1 fL (ref 80.0–100.0)
Platelets: 124 10*3/uL — ABNORMAL LOW (ref 150–400)
RBC: 2.65 MIL/uL — ABNORMAL LOW (ref 4.22–5.81)
RDW: 13.7 % (ref 11.5–15.5)
WBC: 11.7 10*3/uL — ABNORMAL HIGH (ref 4.0–10.5)
nRBC: 0 % (ref 0.0–0.2)

## 2020-08-26 LAB — BASIC METABOLIC PANEL
Anion gap: 9 (ref 5–15)
BUN: 22 mg/dL (ref 8–23)
CO2: 20 mmol/L — ABNORMAL LOW (ref 22–32)
Calcium: 7.9 mg/dL — ABNORMAL LOW (ref 8.9–10.3)
Chloride: 106 mmol/L (ref 98–111)
Creatinine, Ser: 1.25 mg/dL — ABNORMAL HIGH (ref 0.61–1.24)
GFR, Estimated: >60 mL/min (ref >60)
Glucose, Bld: 111 mg/dL — ABNORMAL HIGH (ref 70–99)
Potassium: 3.8 mmol/L (ref 3.5–5.1)
Sodium: 135 mmol/L (ref 135–145)

## 2020-08-26 LAB — GLUCOSE, CAPILLARY
Glucose-Capillary: 111 mg/dL — ABNORMAL HIGH (ref 70–99)
Glucose-Capillary: 127 mg/dL — ABNORMAL HIGH (ref 70–99)
Glucose-Capillary: 126 mg/dL — ABNORMAL HIGH (ref 70–99)
Glucose-Capillary: 115 mg/dL — ABNORMAL HIGH (ref 70–99)
Glucose-Capillary: 108 mg/dL — ABNORMAL HIGH (ref 70–99)
Glucose-Capillary: 111 mg/dL — ABNORMAL HIGH (ref 70–99)
Glucose-Capillary: 122 mg/dL — ABNORMAL HIGH (ref 70–99)

## 2020-08-26 LAB — ECHO INTRAOPERATIVE TEE
Height: 70 in
Weight: 2449.6 oz

## 2020-08-26 MED ORDER — AMIODARONE IV BOLUS ONLY 150 MG/100ML
150.0000 mg | Freq: Once | INTRAVENOUS | Status: AC
  Administered 2020-08-26: 150 mg via INTRAVENOUS
  Filled 2020-08-26: qty 100

## 2020-08-26 MED ORDER — CLOPIDOGREL BISULFATE 75 MG PO TABS
75.0000 mg | ORAL_TABLET | Freq: Every day | ORAL | Status: DC
  Administered 2020-08-26 – 2020-08-30 (×5): 75 mg via ORAL
  Filled 2020-08-26 (×5): qty 1

## 2020-08-26 MED ORDER — AMIODARONE HCL IN DEXTROSE 360-4.14 MG/200ML-% IV SOLN
60.0000 mg/h | INTRAVENOUS | Status: AC
  Administered 2020-08-26: 60 mg/h via INTRAVENOUS

## 2020-08-26 MED ORDER — FUROSEMIDE 10 MG/ML IJ SOLN
40.0000 mg | Freq: Every day | INTRAMUSCULAR | Status: DC
  Administered 2020-08-26 – 2020-08-28 (×3): 40 mg via INTRAVENOUS
  Filled 2020-08-26 (×3): qty 4

## 2020-08-26 MED ORDER — AMIODARONE HCL IN DEXTROSE 360-4.14 MG/200ML-% IV SOLN
30.0000 mg/h | INTRAVENOUS | Status: DC
  Administered 2020-08-26 (×2): 30 mg/h via INTRAVENOUS
  Filled 2020-08-26 (×3): qty 200

## 2020-08-26 MED ORDER — ASPIRIN EC 81 MG PO TBEC
81.0000 mg | DELAYED_RELEASE_TABLET | Freq: Every day | ORAL | Status: DC
  Administered 2020-08-26 – 2020-08-28 (×3): 81 mg via ORAL
  Filled 2020-08-26 (×3): qty 1

## 2020-08-26 MED FILL — Magnesium Sulfate Inj 50%: INTRAMUSCULAR | Qty: 10 | Status: AC

## 2020-08-26 MED FILL — Potassium Chloride Inj 2 mEq/ML: INTRAVENOUS | Qty: 40 | Status: AC

## 2020-08-26 MED FILL — Heparin Sodium (Porcine) Inj 1000 Unit/ML: INTRAMUSCULAR | Qty: 30 | Status: AC

## 2020-08-26 NOTE — Progress Notes (Signed)
2 Days Post-Op Procedure(s) (LRB): CORONARY ARTERY BYPASS GRAFTING (CABG) X3 USING BILATERAL MAMMARY ARTERIES AND RIGHT RADIAL ARTERY (N/A) TRANSESOPHAGEAL ECHOCARDIOGRAM (TEE) (N/A) INDOCYANINE GREEN FLUORESCENCE IMAGING (ICG) (N/A) Subjective: Feeling dizzy with postural changes  Objective: Vital signs in last 24 hours: Temp:  [97.6 F (36.4 C)-98.7 F (37.1 C)] 98.4 F (36.9 C) (05/19 0630) Pulse Rate:  [75-120] 75 (05/19 0700) Cardiac Rhythm: Atrial fibrillation (05/19 0300) Resp:  [15-44] 20 (05/19 0700) BP: (91-106)/(63-75) 91/73 (05/19 0700) SpO2:  [92 %-98 %] 95 % (05/19 0700) Arterial Line BP: (79-137)/(46-84) 79/77 (05/19 0330) Weight:  [77.7 kg] 77.7 kg (05/19 0600)  Hemodynamic parameters for last 24 hours: PAP: (24-38)/(10-16) 29/12  Intake/Output from previous day: 05/18 0701 - 05/19 0700 In: 3126.9 [I.V.:2754.1; IV Piggyback:372.8] Out: 1340 [Urine:800; Chest Tube:540] Intake/Output this shift: No intake/output data recorded.  General appearance: alert and cooperative Neurologic: intact Heart: regular rate and rhythm, S1, S2 normal, no murmur, click, rub or gallop Lungs: clear to auscultation bilaterally Abdomen: soft, non-tender; bowel sounds normal; no masses,  no organomegaly Extremities: extremities normal, atraumatic, no cyanosis or edema Wound: c/d/i  Lab Results: Recent Labs    08/25/20 1637 08/26/20 0339  WBC 12.4* 11.7*  HGB 8.4* 8.1*  HCT 25.6* 25.2*  PLT 141* 124*   BMET:  Recent Labs    08/25/20 1637 08/26/20 0339  NA 135 135  K 3.9 3.8  CL 106 106  CO2 22 20*  GLUCOSE 105* 111*  BUN 17 22  CREATININE 1.19 1.25*  CALCIUM 7.8* 7.9*    PT/INR:  Recent Labs    08/24/20 1301  LABPROT 15.4*  INR 1.2   ABG    Component Value Date/Time   PHART 7.344 (L) 08/24/2020 1810   HCO3 20.0 08/24/2020 1810   TCO2 21 (L) 08/24/2020 1810   ACIDBASEDEF 5.0 (H) 08/24/2020 1810   O2SAT 96.0 08/24/2020 1810   CBG (last 3)  Recent  Labs    08/25/20 2336 08/26/20 0349 08/26/20 0629  GLUCAP 92 111* 127*    Assessment/Plan: S/P Procedure(s) (LRB): CORONARY ARTERY BYPASS GRAFTING (CABG) X3 USING BILATERAL MAMMARY ARTERIES AND RIGHT RADIAL ARTERY (N/A) TRANSESOPHAGEAL ECHOCARDIOGRAM (TEE) (N/A) INDOCYANINE GREEN FLUORESCENCE IMAGING (ICG) (N/A) Mobilize Diuresis DAPT  Remove chest tubes later today   LOS: 8 days    Tyler Kim 08/26/2020

## 2020-08-26 NOTE — Progress Notes (Signed)
Progress Note  Patient Name: Tyler Kim Date of Encounter: 08/26/2020  [facility] Cardiologist: None   Subjective   Some nausea and dizziness this morning.  Noted to have atrial fibrillation with RVR last night now back in normal sinus rhythm on IV amiodarone.  Inpatient Medications    Scheduled Meds: . acetaminophen  1,000 mg Oral Q6H   Or  . acetaminophen (TYLENOL) oral liquid 160 mg/5 mL  1,000 mg Per Tube Q6H  . aspirin EC  325 mg Oral Daily   Or  . aspirin  324 mg Per Tube Daily  . atorvastatin  80 mg Oral Daily  . bisacodyl  10 mg Oral Daily   Or  . bisacodyl  10 mg Rectal Daily  . Chlorhexidine Gluconate Cloth  6 each Topical Daily  . colchicine  0.3 mg Oral BID  . docusate sodium  200 mg Oral Daily  . insulin aspart  0-24 Units Subcutaneous Q4H  . isosorbide dinitrate  5 mg Oral TID  . ketorolac  7.5 mg Intravenous Q6H  . pantoprazole  40 mg Oral Daily  . sodium chloride flush  10-40 mL Intracatheter Q12H  . sodium chloride flush  3 mL Intravenous Q12H   Continuous Infusions: . sodium chloride 20 mL/hr at 08/26/20 0700  . sodium chloride    . sodium chloride    . amiodarone 60 mg/hr (08/26/20 0700)   Followed by  . amiodarone    . electrolyte-A 75 mL/hr at 08/26/20 0700  . epinephrine Stopped (08/25/20 1135)  . lactated ringers    . lactated ringers    . lactated ringers Stopped (08/25/20 1641)  . phenylephrine (NEO-SYNEPHRINE) Adult infusion Stopped (08/25/20 1502)   PRN Meds: sodium chloride, lactated ringers, levalbuterol, metoprolol tartrate, morphine injection, ondansetron (ZOFRAN) IV, oxyCODONE, sodium chloride flush, sodium chloride flush, traMADol   Vital Signs    Vitals:   08/26/20 0500 08/26/20 0600 08/26/20 0630 08/26/20 0700  BP: 94/63 106/75  91/73  Pulse: (!) 120 (!) 101  75  Resp: (!) 25 (!) 27  20  Temp:   98.4 F (36.9 C)   TempSrc:   Oral   SpO2: 93% 92%  95%  Weight:  77.7 kg    Height:        Intake/Output Summary  (Last 24 hours) at 08/26/2020 0740 Last data filed at 08/26/2020 0700 Gross per 24 hour  Intake 3126.86 ml  Output 1340 ml  Net 1786.86 ml   Last 3 Weights 08/26/2020 08/25/2020 08/24/2020  Weight (lbs) 171 lb 4.8 oz 165 lb 5.5 oz 153 lb 1.6 oz  Weight (kg) 77.7 kg 75 kg 69.446 kg      Telemetry    Normal sinus rhythm, no longer atrial pacing, a period of atrial fibrillation with RVR overnight now back in sinus rhythm - Personally Reviewed   Physical Exam  Alert, oriented, no distress GEN: No acute distress.   Neck: No JVD Cardiac: RRR, pericardial rub noted Respiratory: Clear to auscultation bilaterally. GI: Soft, nontender, non-distended  MS:  Trace bilateral pretibial edema; No deformity. Neuro:  Nonfocal  Psych: Normal affect   Labs    High Sensitivity Troponin:   Recent Labs  Lab 08/18/20 1714 08/18/20 1914 08/19/20 0401  TROPONINIHS >27,000* >27,000* >27,000*      Chemistry Recent Labs  Lab 08/23/20 1610 08/24/20 9604 08/24/20 5409 08/25/20 0555 08/25/20 1637 08/26/20 0339  NA 137 137   < > 136 135 135  K 3.8 3.8   < >  4.3 3.9 3.8  CL 109 107   < > 108 106 106  CO2 22 24   < > 22 22 20*  GLUCOSE 99 108*   < > 133* 105* 111*  BUN 23 22   < > 16 17 22   CREATININE 1.16 1.20   < > 1.08 1.19 1.25*  CALCIUM 8.6* 8.4*   < > 7.6* 7.8* 7.9*  PROT 6.0* 5.8*  --  5.0*  --   --   ALBUMIN 2.9* 2.9*  --  3.1*  --   --   AST 39 40  --  39  --   --   ALT 42 42  --  26  --   --   ALKPHOS 66 65  --  40  --   --   BILITOT 0.5 0.6  --  0.7  --   --   GFRNONAA >60 >60   < > >60 >60 >60  ANIONGAP 6 6   < > 6 7 9    < > = values in this interval not displayed.     Hematology Recent Labs  Lab 08/25/20 0555 08/25/20 1637 08/26/20 0339  WBC 18.6* 12.4* 11.7*  RBC 2.82* 2.71* 2.65*  HGB 8.8* 8.4* 8.1*  HCT 26.7* 25.6* 25.2*  MCV 94.7 94.5 95.1  MCH 31.2 31.0 30.6  MCHC 33.0 32.8 32.1  RDW 13.2 13.6 13.7  PLT 159 141* 124*    BNP Recent Labs  Lab  08/21/20 0900  BNP 279.1*     DDimer No results for input(s): DDIMER in the last 168 hours.   Radiology    DG Chest Port 1 View  Result Date: 08/25/2020 CLINICAL DATA:  Status post cardiac surgery. EXAM: PORTABLE CHEST 1 VIEW COMPARISON:  Aug 24, 2020. FINDINGS: Stable cardiomediastinal silhouette. Bilateral chest tubes are noted. Small left apical pneumothorax is noted. Mild bibasilar subsegmental atelectasis is noted. Right internal jugular Swan-Ganz catheter is directed into right pulmonary artery. Endotracheal and nasogastric tubes have been removed. Bony thorax is unremarkable. IMPRESSION: Bilateral chest tubes are again noted. Small left apical pneumothorax is noted. Endotracheal and nasogastric tubes have been removed. Electronically Signed   By: 08/27/2020 M.D.   On: 08/25/2020 08:25   DG Chest Port 1 View  Result Date: 08/24/2020 CLINICAL DATA:  Hypoxia EXAM: PORTABLE CHEST 1 VIEW COMPARISON:  Aug 18, 2020 FINDINGS: Endotracheal tube tip is 4.9 cm above the carina. Nasogastric tube tip and side port below the diaphragm. Swan-Ganz catheter tip is in the right main pulmonary artery. There is a chest tube on each side as well as a mediastinal drain. Temporary pacemaker wires are attached to the right heart. No pneumothorax. There is mild medial left base atelectasis. Lungs elsewhere clear. Heart size and pulmonary vascularity are normal. No adenopathy. Status post median sternotomy with surgical clips present. There is aortic atherosclerosis. No bone lesions. IMPRESSION: Tube and catheter positions as described without evident pneumothorax. Medial left base atelectasis. Lungs otherwise clear. Heart size normal. Aortic Atherosclerosis (ICD10-I70.0). Electronically Signed   By: 08/26/2020 III M.D.   On: 08/24/2020 13:52     Patient Profile     65 y.o. male admitted5/12/22after experiencing digestive symptoms commencing at 9:30 AM. He ultimately presented to an urgent care  center and was recommended to go to [facility] long hospital for further evaluation. At the time he was asymptomatic and ECG confirmed late presentation anterior MI>>cah with severe 3VD>> patient underwent CABG 08/24/20.  Assessment &  Plan    1.  Acute myocardial infarction, late presentation: Patient has severe multivessel coronary artery disease and severe LV dysfunction, treated with multivessel CABG 08/24/2020 now postoperative day #2.  Currently on aspirin, high-dose atorvastatin, and isosorbide. 2.  Paroxysmal/postoperative atrial fibrillation with RVR: Now back in normal sinus rhythm on IV amiodarone.    For questions or updates, please contact [facility] Please consult [email or web address] for contact info under        Signed, Tonny Bollman, MD  08/26/2020, 7:40 AM

## 2020-08-27 ENCOUNTER — Inpatient Hospital Stay (HOSPITAL_COMMUNITY): Payer: BC Managed Care – PPO

## 2020-08-27 LAB — GLUCOSE, CAPILLARY
Glucose-Capillary: 103 mg/dL — ABNORMAL HIGH (ref 70–99)
Glucose-Capillary: 105 mg/dL — ABNORMAL HIGH (ref 70–99)
Glucose-Capillary: 123 mg/dL — ABNORMAL HIGH (ref 70–99)
Glucose-Capillary: 115 mg/dL — ABNORMAL HIGH (ref 70–99)
Glucose-Capillary: 120 mg/dL — ABNORMAL HIGH (ref 70–99)

## 2020-08-27 LAB — CBC
HCT: 26 % — ABNORMAL LOW (ref 39.0–52.0)
Hemoglobin: 8.4 g/dL — ABNORMAL LOW (ref 13.0–17.0)
MCH: 30.5 pg (ref 26.0–34.0)
MCHC: 32.3 g/dL (ref 30.0–36.0)
MCV: 94.5 fL (ref 80.0–100.0)
Platelets: 190 10*3/uL (ref 150–400)
RBC: 2.75 MIL/uL — ABNORMAL LOW (ref 4.22–5.81)
RDW: 14.1 % (ref 11.5–15.5)
WBC: 15.7 10*3/uL — ABNORMAL HIGH (ref 4.0–10.5)
nRBC: 0 % (ref 0.0–0.2)

## 2020-08-27 LAB — BASIC METABOLIC PANEL
Anion gap: 6 (ref 5–15)
BUN: 32 mg/dL — ABNORMAL HIGH (ref 8–23)
CO2: 25 mmol/L (ref 22–32)
Calcium: 7.8 mg/dL — ABNORMAL LOW (ref 8.9–10.3)
Chloride: 105 mmol/L (ref 98–111)
Creatinine, Ser: 1.31 mg/dL — ABNORMAL HIGH (ref 0.61–1.24)
GFR, Estimated: >60 mL/min (ref >60)
Glucose, Bld: 101 mg/dL — ABNORMAL HIGH (ref 70–99)
Potassium: 3.8 mmol/L (ref 3.5–5.1)
Sodium: 136 mmol/L (ref 135–145)

## 2020-08-27 LAB — PREPARE RBC (CROSSMATCH)

## 2020-08-27 MED ORDER — METOPROLOL TARTRATE 12.5 MG HALF TABLET
12.5000 mg | ORAL_TABLET | Freq: Three times a day (TID) | ORAL | Status: DC
  Administered 2020-08-27 – 2020-08-30 (×6): 12.5 mg via ORAL
  Filled 2020-08-27 (×7): qty 1

## 2020-08-27 MED ORDER — METOPROLOL TARTRATE 5 MG/5ML IV SOLN
2.5000 mg | Freq: Three times a day (TID) | INTRAVENOUS | Status: AC
  Administered 2020-08-27 – 2020-08-28 (×2): 2.5 mg via INTRAVENOUS
  Filled 2020-08-27 (×2): qty 5

## 2020-08-27 MED ORDER — AMIODARONE HCL 200 MG PO TABS
400.0000 mg | ORAL_TABLET | Freq: Two times a day (BID) | ORAL | Status: DC
  Administered 2020-08-27 – 2020-08-30 (×6): 400 mg via ORAL
  Filled 2020-08-27 (×6): qty 2

## 2020-08-27 MED ORDER — METOCLOPRAMIDE HCL 5 MG PO TABS
5.0000 mg | ORAL_TABLET | Freq: Three times a day (TID) | ORAL | Status: DC
  Administered 2020-08-27 – 2020-08-28 (×2): 5 mg via ORAL
  Filled 2020-08-27 (×5): qty 1

## 2020-08-27 MED ORDER — KETOROLAC TROMETHAMINE 15 MG/ML IJ SOLN: 7.5000 mg | Freq: Four times a day (QID) | INTRAMUSCULAR | Status: DC

## 2020-08-27 MED ORDER — POTASSIUM CHLORIDE CRYS ER 20 MEQ PO TBCR
20.0000 meq | EXTENDED_RELEASE_TABLET | Freq: Two times a day (BID) | ORAL | Status: DC
  Administered 2020-08-27 – 2020-08-28 (×2): 20 meq via ORAL
  Filled 2020-08-27 (×2): qty 1

## 2020-08-27 MED ORDER — POTASSIUM CHLORIDE 10 MEQ/50ML IV SOLN
10.0000 meq | INTRAVENOUS | Status: AC
  Administered 2020-08-27 (×3): 10 meq via INTRAVENOUS
  Filled 2020-08-27 (×3): qty 50

## 2020-08-27 MED ORDER — SODIUM CHLORIDE 0.9% IV SOLUTION: Freq: Once | INTRAVENOUS | Status: DC

## 2020-08-27 MED ORDER — METOPROLOL TARTRATE 12.5 MG HALF TABLET: 12.5000 mg | ORAL_TABLET | Freq: Two times a day (BID) | ORAL | Status: DC

## 2020-08-27 MED ORDER — KETOROLAC TROMETHAMINE 15 MG/ML IJ SOLN
7.5000 mg | Freq: Four times a day (QID) | INTRAMUSCULAR | Status: DC
  Administered 2020-08-27 – 2020-08-28 (×3): 7.5 mg via INTRAVENOUS
  Filled 2020-08-27 (×3): qty 1

## 2020-08-27 MED FILL — Electrolyte-R (PH 7.4) Solution: INTRAVENOUS | Qty: 3000 | Status: AC

## 2020-08-27 MED FILL — Mannitol IV Soln 20%: INTRAVENOUS | Qty: 500 | Status: AC

## 2020-08-27 MED FILL — Sodium Chloride IV Soln 0.9%: INTRAVENOUS | Qty: 2000 | Status: AC

## 2020-08-27 MED FILL — Sodium Bicarbonate IV Soln 8.4%: INTRAVENOUS | Qty: 50 | Status: AC

## 2020-08-27 MED FILL — Heparin Sodium (Porcine) Inj 1000 Unit/ML: INTRAMUSCULAR | Qty: 10 | Status: AC

## 2020-08-27 NOTE — Progress Notes (Signed)
RN aware of Central line discharge

## 2020-08-27 NOTE — Progress Notes (Signed)
Patient ID: Tyler Kim, male   DOB: 12/24/1955, 65 y.o.   MRN: [id number]  TCTS Evening Rounds:   Hemodynamically stable  Sinus with frequent PAC's on amiodarone.  He is being transfused 1 unit PRBC's  Urine output adequate.    CBC    Component Value Date/Time   WBC 15.7 (H) 08/27/2020 0428   RBC 2.75 (L) 08/27/2020 0428   HGB 8.4 (L) 08/27/2020 0428   HCT 26.0 (L) 08/27/2020 0428   PLT 190 08/27/2020 0428   MCV 94.5 08/27/2020 0428   MCH 30.5 08/27/2020 0428   MCHC 32.3 08/27/2020 0428   RDW 14.1 08/27/2020 0428   LYMPHSABS 1.1 08/18/2020 1714   MONOABS 1.0 08/18/2020 1714   EOSABS 0.0 08/18/2020 1714   BASOSABS 0.0 08/18/2020 1714     BMET    Component Value Date/Time   NA 136 08/27/2020 0428   K 3.8 08/27/2020 0428   CL 105 08/27/2020 0428   CO2 25 08/27/2020 0428   GLUCOSE 101 (H) 08/27/2020 0428   BUN 32 (H) 08/27/2020 0428   CREATININE 1.31 (H) 08/27/2020 0428   CALCIUM 7.8 (L) 08/27/2020 0428   GFRNONAA >60 08/27/2020 0428     A/P:  Stable postop course. Continue current plans

## 2020-08-27 NOTE — Progress Notes (Signed)
Pt was nauseous and had pain in his chest this AM when it was time to go for the 2 view chest xray. Zofran and oxy were given. Pain and nausea was reassessed 30-40 mins later and the pt felt he was unable to do the walk or go down for the CXR so he asked to hold off for a while.

## 2020-08-27 NOTE — Progress Notes (Signed)
Progress Note  Patient Name: Tyler Kim Date of Encounter: 08/27/2020  [facility] Cardiologist: None   Subjective   Complaining of nausea overnight and this morning, no other complaints.  No chest pain.  Patient went into atrial fibrillation early this morning.  Inpatient Medications    Scheduled Meds: . sodium chloride   Intravenous Once  . acetaminophen  1,000 mg Oral Q6H   Or  . acetaminophen (TYLENOL) oral liquid 160 mg/5 mL  1,000 mg Per Tube Q6H  . amiodarone  400 mg Oral BID  . aspirin EC  81 mg Oral Daily  . atorvastatin  80 mg Oral Daily  . bisacodyl  10 mg Oral Daily   Or  . bisacodyl  10 mg Rectal Daily  . Chlorhexidine Gluconate Cloth  6 each Topical Daily  . clopidogrel  75 mg Oral Daily  . docusate sodium  200 mg Oral Daily  . furosemide  40 mg Intravenous Daily  . insulin aspart  0-24 Units Subcutaneous Q4H  . isosorbide dinitrate  5 mg Oral TID  . metoCLOPramide  5 mg Oral TID AC & HS  . pantoprazole  40 mg Oral Daily  . potassium chloride  20 mEq Oral BID   Continuous Infusions: . sodium chloride 20 mL/hr at 08/27/20 0800  . sodium chloride    . sodium chloride    . lactated ringers    . lactated ringers    . potassium chloride     PRN Meds: sodium chloride, lactated ringers, levalbuterol, metoprolol tartrate, morphine injection, ondansetron (ZOFRAN) IV, oxyCODONE, sodium chloride flush, sodium chloride flush, traMADol   Vital Signs    Vitals:   08/27/20 0600 08/27/20 0656 08/27/20 0700 08/27/20 0800  BP: 117/83  123/72 115/84  Pulse: (!) 104  (!) 111 (!) 121  Resp: (!) 27  (!) 23 (!) 26  Temp:  98.1 F (36.7 C)    TempSrc:  Oral    SpO2: 96%  95% 95%  Weight:      Height:        Intake/Output Summary (Last 24 hours) at 08/27/2020 0841 Last data filed at 08/27/2020 0800 Gross per 24 hour  Intake 1059.77 ml  Output 1495 ml  Net -435.23 ml   Last 3 Weights 08/26/2020 08/25/2020 08/24/2020  Weight (lbs) 171 lb 4.8 oz 165 lb 5.5 oz  153 lb 1.6 oz  Weight (kg) 77.7 kg 75 kg 69.446 kg      Telemetry    Normal sinus rhythm then atrial fibrillation approximately 5:30 AM today, heart rate currently 120's - Personally Reviewed  Physical Exam  Alert, oriented, NAD GEN: No acute distress.   Neck: No JVD Cardiac: irregularly irregular, no murmurs, rubs, or gallops.  Respiratory: Clear to auscultation bilaterally. GI: Soft, nontender, non-distended  MS: 1+ BL pedal edema; No deformity. Neuro:  Nonfocal  Psych: Normal affect   Labs    High Sensitivity Troponin:   Recent Labs  Lab 08/18/20 1714 08/18/20 1914 08/19/20 0401  TROPONINIHS >27,000* >27,000* >27,000*      Chemistry Recent Labs  Lab 08/23/20 1941 08/24/20 7408 08/24/20 1448 08/25/20 0555 08/25/20 1637 08/26/20 0339 08/27/20 0428  NA 137 137   < > 136 135 135 136  K 3.8 3.8   < > 4.3 3.9 3.8 3.8  CL 109 107   < > 108 106 106 105  CO2 22 24   < > 22 22 20* 25  GLUCOSE 99 108*   < > 133*  105* 111* 101*  BUN 23 22   < > 16 17 22  32*  CREATININE 1.16 1.20   < > 1.08 1.19 1.25* 1.31*  CALCIUM 8.6* 8.4*   < > 7.6* 7.8* 7.9* 7.8*  PROT 6.0* 5.8*  --  5.0*  --   --   --   ALBUMIN 2.9* 2.9*  --  3.1*  --   --   --   AST 39 40  --  39  --   --   --   ALT 42 42  --  26  --   --   --   ALKPHOS 66 65  --  40  --   --   --   BILITOT 0.5 0.6  --  0.7  --   --   --   GFRNONAA >60 >60   < > >60 >60 >60 >60  ANIONGAP 6 6   < > 6 7 9 6    < > = values in this interval not displayed.     Hematology Recent Labs  Lab 08/25/20 1637 08/26/20 0339 08/27/20 0428  WBC 12.4* 11.7* 15.7*  RBC 2.71* 2.65* 2.75*  HGB 8.4* 8.1* 8.4*  HCT 25.6* 25.2* 26.0*  MCV 94.5 95.1 94.5  MCH 31.0 30.6 30.5  MCHC 32.8 32.1 32.3  RDW 13.6 13.7 14.1  PLT 141* 124* 190    BNP Recent Labs  Lab 08/21/20 0900  BNP 279.1*     DDimer No results for input(s): DDIMER in the last 168 hours.   Radiology    DG Chest 1 View  Result Date: 08/26/2020 CLINICAL DATA:   Chest soreness. EXAM: CHEST  1 VIEW COMPARISON:  08/25/2020. FINDINGS: Interim removal Swan-Ganz catheter. Right IJ sheath, mediastinal drainage catheter, bilateral chest tubes in stable position. Bibasilar atelectasis. Tiny bilateral pleural effusions. Stable tiny left apical pneumothorax. IMPRESSION: 1. Interim removal Swan-Ganz catheter. Remaining lines and tubes including bilateral chest tubes in stable position. Stable tiny left apical pneumothorax. 2.  Prior CABG.  Stable cardiomegaly. 3. Low lung volumes with mild bibasilar atelectasis. Tiny bilateral pleural effusions again noted. Electronically Signed   By: 08/28/2020  Register   On: 08/26/2020 10:49     Patient Profile     65 y.o. male admitted5/12/22after experiencing digestive symptoms commencing at 9:30 AM. He ultimately presented to an urgent care center and was recommended to go to [facility] long hospital for further evaluation. At the time he was asymptomatic and ECG confirmed late presentation anterior MI>>cah with severe 3VD>> patient underwent CABG 08/24/20.  Assessment & Plan    1.  Late presenting acute MI with multivessel CAD: Now postoperative day #3 from CABG.  Treated with aspirin, clopidogrel, and atorvastatin 80 mg.  We will add low-dose metoprolol today as his blood pressure has improved and likely will tolerate this. 2.  Atrial fibrillation with RVR: Patient back in atrial fibrillation on IV amiodarone this morning.  Add low-dose metoprolol. 3.  Nausea: Per surgical service.  Reglan added this morning.  For questions or updates, please contact [facility] Please consult [email or web address] for contact info under     Signed, [facility], MD  08/27/2020, 8:41 AM

## 2020-08-27 NOTE — Progress Notes (Signed)
3 Days Post-Op Procedure(s) (LRB): CORONARY ARTERY BYPASS GRAFTING (CABG) X3 USING BILATERAL MAMMARY ARTERIES AND RIGHT RADIAL ARTERY (N/A) TRANSESOPHAGEAL ECHOCARDIOGRAM (TEE) (N/A) INDOCYANINE GREEN FLUORESCENCE IMAGING (ICG) (N/A) Subjective: Nauseated, chest tube pain  Objective: Vital signs in last 24 hours: Temp:  [97.9 F (36.6 C)-98.4 F (36.9 C)] 98.1 F (36.7 C) (05/20 0656) Pulse Rate:  [68-121] 121 (05/20 0800) Cardiac Rhythm: Atrial fibrillation (05/20 0800) Resp:  [9-29] 26 (05/20 0800) BP: (98-125)/(67-84) 115/84 (05/20 0800) SpO2:  [93 %-96 %] 95 % (05/20 0800)  Hemodynamic parameters for last 24 hours:    Intake/Output from previous day: 05/19 0701 - 05/20 0700 In: 987.5 [I.V.:987.5] Out: 1495 [Urine:1155; Chest Tube:340] Intake/Output this shift: Total I/O In: 72.3 [I.V.:72.3] Out: -   General appearance: alert and cooperative Neurologic: intact Heart: irregularly irregular rhythm Lungs: clear to auscultation bilaterally Abdomen: soft, non-tender; bowel sounds normal; no masses,  no organomegaly Extremities: edema 1+ Wound: c/d/i  Lab Results: Recent Labs    08/26/20 0339 08/27/20 0428  WBC 11.7* 15.7*  HGB 8.1* 8.4*  HCT 25.2* 26.0*  PLT 124* 190   BMET:  Recent Labs    08/26/20 0339 08/27/20 0428  NA 135 136  K 3.8 3.8  CL 106 105  CO2 20* 25  GLUCOSE 111* 101*  BUN 22 32*  CREATININE 1.25* 1.31*  CALCIUM 7.9* 7.8*    PT/INR:  Recent Labs    08/24/20 1301  LABPROT 15.4*  INR 1.2   ABG    Component Value Date/Time   PHART 7.344 (L) 08/24/2020 1810   HCO3 20.0 08/24/2020 1810   TCO2 21 (L) 08/24/2020 1810   ACIDBASEDEF 5.0 (H) 08/24/2020 1810   O2SAT 96.0 08/24/2020 1810   CBG (last 3)  Recent Labs    08/26/20 2313 08/27/20 0428 08/27/20 0656  GLUCAP 122* 103* 105*    Assessment/Plan: S/P Procedure(s) (LRB): CORONARY ARTERY BYPASS GRAFTING (CABG) X3 USING BILATERAL MAMMARY ARTERIES AND RIGHT RADIAL ARTERY  (N/A) TRANSESOPHAGEAL ECHOCARDIOGRAM (TEE) (N/A) INDOCYANINE GREEN FLUORESCENCE IMAGING (ICG) (N/A) Mobilize Diuresis d/c tubes/lines  txfuse one unit PRBC   LOS: 9 days    Linden Dolin 08/27/2020

## 2020-08-28 ENCOUNTER — Inpatient Hospital Stay (HOSPITAL_COMMUNITY): Payer: BC Managed Care – PPO

## 2020-08-28 LAB — BPAM RBC
Blood Product Expiration Date: 202206162359
ISSUE DATE / TIME: 202205201658
Unit Type and Rh: 5100

## 2020-08-28 LAB — TYPE AND SCREEN
ABO/RH(D): O POS
Antibody Screen: NEGATIVE
Unit division: 0

## 2020-08-28 LAB — CBC
HCT: 31.8 % — ABNORMAL LOW (ref 39.0–52.0)
Hemoglobin: 10.4 g/dL — ABNORMAL LOW (ref 13.0–17.0)
MCH: 30 pg (ref 26.0–34.0)
MCHC: 32.7 g/dL (ref 30.0–36.0)
MCV: 91.6 fL (ref 80.0–100.0)
Platelets: 273 10*3/uL (ref 150–400)
RBC: 3.47 MIL/uL — ABNORMAL LOW (ref 4.22–5.81)
RDW: 14.9 % (ref 11.5–15.5)
WBC: 23.8 10*3/uL — ABNORMAL HIGH (ref 4.0–10.5)
nRBC: 0 % (ref 0.0–0.2)

## 2020-08-28 LAB — GLUCOSE, CAPILLARY
Glucose-Capillary: 112 mg/dL — ABNORMAL HIGH (ref 70–99)
Glucose-Capillary: 114 mg/dL — ABNORMAL HIGH (ref 70–99)
Glucose-Capillary: 126 mg/dL — ABNORMAL HIGH (ref 70–99)
Glucose-Capillary: 182 mg/dL — ABNORMAL HIGH (ref 70–99)

## 2020-08-28 LAB — BASIC METABOLIC PANEL
Anion gap: 9 (ref 5–15)
BUN: 41 mg/dL — ABNORMAL HIGH (ref 8–23)
CO2: 23 mmol/L (ref 22–32)
Calcium: 8.4 mg/dL — ABNORMAL LOW (ref 8.9–10.3)
Chloride: 105 mmol/L (ref 98–111)
Creatinine, Ser: 1.45 mg/dL — ABNORMAL HIGH (ref 0.61–1.24)
GFR, Estimated: 54 mL/min — ABNORMAL LOW (ref >60)
Glucose, Bld: 116 mg/dL — ABNORMAL HIGH (ref 70–99)
Potassium: 4.6 mmol/L (ref 3.5–5.1)
Sodium: 137 mmol/L (ref 135–145)

## 2020-08-28 MED ORDER — POTASSIUM CHLORIDE CRYS ER 20 MEQ PO TBCR
20.0000 meq | EXTENDED_RELEASE_TABLET | Freq: Two times a day (BID) | ORAL | Status: DC
  Administered 2020-08-28 – 2020-08-30 (×4): 20 meq via ORAL
  Filled 2020-08-28 (×4): qty 1

## 2020-08-28 MED ORDER — SODIUM CHLORIDE 0.9 % IV SOLN: 250.0000 mL | INTRAVENOUS | Status: DC | PRN

## 2020-08-28 MED ORDER — ONDANSETRON HCL 4 MG PO TABS: 4.0000 mg | ORAL_TABLET | Freq: Four times a day (QID) | ORAL | Status: DC | PRN

## 2020-08-28 MED ORDER — TRAMADOL HCL 50 MG PO TABS: 50.0000 mg | ORAL_TABLET | Freq: Four times a day (QID) | ORAL | Status: DC | PRN

## 2020-08-28 MED ORDER — FUROSEMIDE 40 MG PO TABS
40.0000 mg | ORAL_TABLET | Freq: Every day | ORAL | Status: DC
  Administered 2020-08-29 – 2020-08-30 (×2): 40 mg via ORAL
  Filled 2020-08-28 (×2): qty 1

## 2020-08-28 MED ORDER — ASPIRIN EC 325 MG PO TBEC
325.0000 mg | DELAYED_RELEASE_TABLET | Freq: Every day | ORAL | Status: DC
  Administered 2020-08-29 – 2020-08-30 (×2): 325 mg via ORAL
  Filled 2020-08-28 (×2): qty 1

## 2020-08-28 MED ORDER — SODIUM CHLORIDE 0.9% FLUSH: 3.0000 mL | INTRAVENOUS | Status: DC | PRN

## 2020-08-28 MED ORDER — OXYCODONE HCL 5 MG PO TABS: 5.0000 mg | ORAL_TABLET | ORAL | Status: DC | PRN

## 2020-08-28 MED ORDER — SODIUM CHLORIDE 0.9% FLUSH
3.0000 mL | Freq: Two times a day (BID) | INTRAVENOUS | Status: DC
  Administered 2020-08-28 – 2020-08-29 (×3): 3 mL via INTRAVENOUS

## 2020-08-28 MED ORDER — AMIODARONE IV BOLUS ONLY 150 MG/100ML
150.0000 mg | Freq: Once | INTRAVENOUS | Status: AC
  Administered 2020-08-28: 150 mg via INTRAVENOUS
  Filled 2020-08-28: qty 100

## 2020-08-28 MED ORDER — ~~LOC~~ CARDIAC SURGERY, PATIENT & FAMILY EDUCATION: Freq: Once | Status: AC

## 2020-08-28 MED ORDER — PANTOPRAZOLE SODIUM 40 MG PO TBEC
40.0000 mg | DELAYED_RELEASE_TABLET | Freq: Every day | ORAL | Status: DC
  Administered 2020-08-29 – 2020-08-30 (×2): 40 mg via ORAL
  Filled 2020-08-28 (×2): qty 1

## 2020-08-28 MED ORDER — ACETAMINOPHEN 325 MG PO TABS: 650.0000 mg | ORAL_TABLET | Freq: Four times a day (QID) | ORAL | Status: DC | PRN

## 2020-08-28 MED ORDER — ONDANSETRON HCL 4 MG/2ML IJ SOLN: 4.0000 mg | Freq: Four times a day (QID) | INTRAMUSCULAR | Status: DC | PRN

## 2020-08-28 MED ORDER — TRAMADOL HCL 50 MG PO TABS
50.0000 mg | ORAL_TABLET | ORAL | Status: DC | PRN
Start: 2020-08-28 — End: 2020-08-28

## 2020-08-28 NOTE — Progress Notes (Signed)
PT transferred to 4 East room 4. Called pt's sister Augusto Gamble to make her aware of his transfer to 4 east. Pt transferred by wheelchair, vss.  Delories Heinz, RN

## 2020-08-28 NOTE — Progress Notes (Addendum)
Pt refusing amio po and any po meds despite being back in afib. BP 100/72, HR 128 to 140 sitting in recliner. Educated pt on need to get HR under control, pt states, " talk to the night shift nurse, she knows I wont take any meds." pt again educated on need to get HR under control. Of note pt refusec po amio on pm shift. Pt states he's too nauseous to take any PO meds despite eating 50-60% of breakfast. Monitoring closely, educating frequently. Will attempt to give prn zofran and iv metoprolol if pt's bp all allow.   Delories Heinz, RN

## 2020-08-28 NOTE — Progress Notes (Signed)
Pt allowed me to administer ASA, plavix, amio and lasix. Will attempt additional meds as soon as patient comfortable with it.  Pt requesting something stronger for nausea, and to move out of ICU.  Pt ambulated to bathroom for BM. 7 liquid stools over night per night shift RN, stool meds held per pt request.    Delories Heinz, RN

## 2020-08-28 NOTE — Progress Notes (Signed)
Pt refused all PO meds last night as they make him nauseous. MDs are aware.

## 2020-08-28 NOTE — Progress Notes (Addendum)
Pt allowed me to give prn zofran. Pt also went back into NSR 70's on own. Pt states he cannot feel his HR when its irregular or beating fast. Pt states he is really ready to transfer out of ICU. Will attempt giving po meds when due. Monitoring closely.  Delories Heinz, RN

## 2020-08-28 NOTE — Progress Notes (Deleted)
Morning labs came back with a K of 4.6. Pt no longer has central line and has refused all PO meds tonight. Will let oncoming RN and MD know to try to find a different route for replacement or a PIV concentration.

## 2020-08-28 NOTE — Progress Notes (Addendum)
4 Days Post-Op Procedure(s) (LRB): CORONARY ARTERY BYPASS GRAFTING (CABG) X3 USING BILATERAL MAMMARY ARTERIES AND RIGHT RADIAL ARTERY (N/A) TRANSESOPHAGEAL ECHOCARDIOGRAM (TEE) (N/A) INDOCYANINE GREEN FLUORESCENCE IMAGING (ICG) (N/A) Subjective: Nausea all day yesterday but had multiple loose stools overnight and feels better this am. Took his meds this am and ate some breakfast.  Objective: Vital signs in last 24 hours: Temp:  [98 F (36.7 C)-99.1 F (37.3 C)] 98.4 F (36.9 C) (05/21 0814) Pulse Rate:  [55-124] 79 (05/21 0952) Cardiac Rhythm: Atrial fibrillation;Normal sinus rhythm (05/21 0800) Resp:  [10-44] 23 (05/21 0952) BP: (95-152)/(65-140) 126/88 (05/21 0930) SpO2:  [92 %-99 %] 97 % (05/21 0952) Weight:  [76.2 kg] 76.2 kg (05/21 0600)  Hemodynamic parameters for last 24 hours:    Intake/Output from previous day: 05/20 0701 - 05/21 0700 In: 1315.2 [P.O.:480; I.V.:367.3; Blood:318; IV Piggyback:149.9] Out: 945 [Urine:945] Intake/Output this shift: No intake/output data recorded.  General appearance: alert and cooperative Neurologic: intact Heart: irregularly irregular rhythm Lungs: clear to auscultation bilaterally Abdomen: soft, non-tender; bowel sounds normal Extremities: no edema Wound: incision ok  Lab Results: Recent Labs    08/27/20 0428 08/28/20 0038  WBC 15.7* 23.8*  HGB 8.4* 10.4*  HCT 26.0* 31.8*  PLT 190 273   BMET:  Recent Labs    08/27/20 0428 08/28/20 0038  NA 136 137  K 3.8 4.6  CL 105 105  CO2 25 23  GLUCOSE 101* 116*  BUN 32* 41*  CREATININE 1.31* 1.45*  CALCIUM 7.8* 8.4*    PT/INR: No results for input(s): LABPROT, INR in the last 72 hours. ABG    Component Value Date/Time   PHART 7.344 (L) 08/24/2020 1810   HCO3 20.0 08/24/2020 1810   TCO2 21 (L) 08/24/2020 1810   ACIDBASEDEF 5.0 (H) 08/24/2020 1810   O2SAT 96.0 08/24/2020 1810   CBG (last 3)  Recent Labs    08/28/20 0029 08/28/20 0332 08/28/20 0802  GLUCAP 112*  114* 182*    Assessment/Plan: S/P Procedure(s) (LRB): CORONARY ARTERY BYPASS GRAFTING (CABG) X3 USING BILATERAL MAMMARY ARTERIES AND RIGHT RADIAL ARTERY (N/A) TRANSESOPHAGEAL ECHOCARDIOGRAM (TEE) (N/A) INDOCYANINE GREEN FLUORESCENCE IMAGING (ICG) (N/A)  POD 4  Hemodynamically stable.  Postop atrial fib with RVR. On oral amio and in and out of AF this am. Will give another bolus of amio.  Volume excess: wt is 15 lbs over preop if accurate. Continue diuretic.   Glucose under good control and no hx of DM, normal Hgb A1c so can stop CBG's.  Leukocytosis without fever. May be inflammatory from surgery. Repeat CBC in am.  Continue IS, ambulation  Transfer to 4E.   LOS: 10 days    Alleen Borne 08/28/2020

## 2020-08-29 LAB — BASIC METABOLIC PANEL
Anion gap: 8 (ref 5–15)
BUN: 41 mg/dL — ABNORMAL HIGH (ref 8–23)
CO2: 23 mmol/L (ref 22–32)
Calcium: 8.1 mg/dL — ABNORMAL LOW (ref 8.9–10.3)
Chloride: 105 mmol/L (ref 98–111)
Creatinine, Ser: 1.39 mg/dL — ABNORMAL HIGH (ref 0.61–1.24)
GFR, Estimated: 57 mL/min — ABNORMAL LOW (ref >60)
Glucose, Bld: 110 mg/dL — ABNORMAL HIGH (ref 70–99)
Potassium: 4.2 mmol/L (ref 3.5–5.1)
Sodium: 136 mmol/L (ref 135–145)

## 2020-08-29 LAB — CBC
HCT: 25.6 % — ABNORMAL LOW (ref 39.0–52.0)
Hemoglobin: 8.4 g/dL — ABNORMAL LOW (ref 13.0–17.0)
MCH: 30.1 pg (ref 26.0–34.0)
MCHC: 32.8 g/dL (ref 30.0–36.0)
MCV: 91.8 fL (ref 80.0–100.0)
Platelets: 280 10*3/uL (ref 150–400)
RBC: 2.79 MIL/uL — ABNORMAL LOW (ref 4.22–5.81)
RDW: 14.8 % (ref 11.5–15.5)
WBC: 16.8 10*3/uL — ABNORMAL HIGH (ref 4.0–10.5)
nRBC: 0 % (ref 0.0–0.2)

## 2020-08-29 LAB — MAGNESIUM: Magnesium: 2.3 mg/dL (ref 1.7–2.4)

## 2020-08-29 MED ORDER — AMIODARONE IV BOLUS ONLY 150 MG/100ML
150.0000 mg | Freq: Once | INTRAVENOUS | Status: AC
  Administered 2020-08-29: 150 mg via INTRAVENOUS
  Filled 2020-08-29: qty 100

## 2020-08-29 NOTE — Progress Notes (Signed)
Patient ambulated in hallway with spouse independently will monitor. Kailin Principato, Randall An RN

## 2020-08-29 NOTE — Progress Notes (Signed)
Mobility Specialist: Progress Note   08/29/20 1238  Mobility  Activity Ambulated in hall  Level of Assistance Independent  Assistive Device None  Distance Ambulated (ft) 470 ft  Mobility Ambulated independently in hallway  Mobility Response Tolerated well  Mobility performed by Mobility specialist  Bed Position Chair  $Mobility charge 1 Mobility   Pre-Mobility: 76 HR, 100/68 BP, 97% SpO2 Post-Mobility: 77 HR, 124/70 BP, 97% SpO2  Pt asx during ambulation. Pt to recliner after walk with call bell in reach, VSS.   [facility] Izola Teague Mobility Specialist Mobility Specialist Phone: [phone]

## 2020-08-29 NOTE — Progress Notes (Signed)
Patient EPW pulled per protocol and as ordered. All ends intact. Slight resistance met with Atrial wires but then were removed. Patient reminded to lie supine approximately one hour. bp 108/74 heart rate on monitor 65. Will monitor patient. Cloer, Kristin Jessup rN  

## 2020-08-29 NOTE — Progress Notes (Signed)
Patient ambulated in hallway 470 feet with nursing staff. Back in chair will monitor patient. Gracelee Stemmler, Randall An RN

## 2020-08-29 NOTE — Progress Notes (Addendum)
[address]       [place] [address]             [phone]       5 Days Post-Op Procedure(s) (LRB): CORONARY ARTERY BYPASS GRAFTING (CABG) X3 USING BILATERAL MAMMARY ARTERIES AND RIGHT RADIAL ARTERY (N/A) TRANSESOPHAGEAL ECHOCARDIOGRAM (TEE) (N/A) INDOCYANINE GREEN FLUORESCENCE IMAGING (ICG) (N/A) Subjective: Up in the bedside chair. Asking to go home. Nausea and loose stools resolved.  Independent with mobility.   Objective: Vital signs in last 24 hours: Temp:  [98 F (36.7 C)-98.7 F (37.1 C)] 98.6 F (37 C) (05/22 0805) Pulse Rate:  [63-130] 66 (05/22 0805) Cardiac Rhythm: Normal sinus rhythm (05/22 0730) Resp:  [8-44] 16 (05/22 0805) BP: (95-129)/(68-88) 107/73 (05/22 0805) SpO2:  [92 %-100 %] 100 % (05/22 0805) FiO2 (%):  [21 %] 21 % (05/21 1401) Weight:  [74.6 kg] 74.6 kg (05/22 0421)    Intake/Output from previous day: 05/21 0701 - 05/22 0700 In: 290 [P.O.:290] Out: 2050 [Urine:2050] Intake/Output this shift: Total I/O In: 240 [P.O.:240] Out: 380 [Urine:380]  General appearance: alert and cooperative Neurologic: intact Heart: RRR, was in atrial fibrillation again ~4am today, VR was controlled Lungs: clear to auscultation bilaterally Abdomen: soft, non-tender; bowel sounds normal Extremities: Bilateral LE edema, good perfusion and normal sensation right hand, Wound: incisions ok  Lab Results: Recent Labs    08/28/20 0038 08/29/20 0106  WBC 23.8* 16.8*  HGB 10.4* 8.4*  HCT 31.8* 25.6*  PLT 273 280   BMET:  Recent Labs    08/28/20 0038 08/29/20 0106  NA 137 136  K 4.6 4.2  CL 105 105  CO2 23 23  GLUCOSE 116* 110*  BUN 41* 41*  CREATININE 1.45* 1.39*  CALCIUM 8.4* 8.1*    PT/INR: No results for input(s): LABPROT, INR in the last 72 hours. 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 Progressing well overall.  Good mobility, on RA. Continue ASA (decrease to 81mg ), Plavix, Lipitor, Lopressor.  Remove pacer wires.   -Postop atrial fib with RVR. On oral amio and in and out of AF again this am. Will repeat 150mg  IV bolus of amio and continue oral load. K+4.5, check Mg++. Metoprolol at 12.5mg  TID, unable to titrate further today with marginal BP.  Will need to consider anticoagulation if PAF persists.   -Volume excess: wt is down 2kg from yesterday but still about 5kg+. Continue diuresis.  -Post-op renal insufficiency- Creat trending toward normal.  -Leukocytosis - Temp 100.6 at midnight, CXR this am showing very mild ATX and small effusion, incisions OK. WBC is trending down. Continue to monitor.   -Expected acute blood loss anemia- significant drop in Hct from yesterday but no evidence of ongoing losses. Supplement iron, monitor.     LOS: 11 days     08/29/2020   Chart reviewed, patient examined, agree with  above. Recurrent atrial fib. Agree with another bolus and continue po amio.  I think the Hgb of 10.4 yesterday was an error because it was 8.4 the day before and now 8.4 today. WBC increased yesterday to 23.8 but I think that was probably lab error too since it was back to 16.8 today. He has been afebrile. He feels well and wants to get home. Possibly home tomorrow if rhythm is stable. Hold off on anticoagulation for now.

## 2020-08-30 DIAGNOSIS — Z951 Presence of aortocoronary bypass graft: Secondary | ICD-10-CM

## 2020-08-30 LAB — BASIC METABOLIC PANEL
Anion gap: 6 (ref 5–15)
BUN: 33 mg/dL — ABNORMAL HIGH (ref 8–23)
CO2: 24 mmol/L (ref 22–32)
Calcium: 8.2 mg/dL — ABNORMAL LOW (ref 8.9–10.3)
Chloride: 107 mmol/L (ref 98–111)
Creatinine, Ser: 1.36 mg/dL — ABNORMAL HIGH (ref 0.61–1.24)
GFR, Estimated: 58 mL/min — ABNORMAL LOW (ref >60)
Glucose, Bld: 94 mg/dL (ref 70–99)
Potassium: 4.3 mmol/L (ref 3.5–5.1)
Sodium: 137 mmol/L (ref 135–145)

## 2020-08-30 LAB — CBC
HCT: 25.3 % — ABNORMAL LOW (ref 39.0–52.0)
Hemoglobin: 8.1 g/dL — ABNORMAL LOW (ref 13.0–17.0)
MCH: 29.7 pg (ref 26.0–34.0)
MCHC: 32 g/dL (ref 30.0–36.0)
MCV: 92.7 fL (ref 80.0–100.0)
Platelets: 300 10*3/uL (ref 150–400)
RBC: 2.73 MIL/uL — ABNORMAL LOW (ref 4.22–5.81)
RDW: 14.8 % (ref 11.5–15.5)
WBC: 12.8 10*3/uL — ABNORMAL HIGH (ref 4.0–10.5)
nRBC: 0 % (ref 0.0–0.2)

## 2020-08-30 MED ORDER — ACETAMINOPHEN 325 MG PO TABS: 650.0000 mg | ORAL_TABLET | Freq: Four times a day (QID) | ORAL | Status: DC | PRN

## 2020-08-30 MED ORDER — ASPIRIN EC 81 MG PO TBEC: 81.0000 mg | DELAYED_RELEASE_TABLET | Freq: Every day | ORAL | Status: DC

## 2020-08-30 MED ORDER — ISOSORBIDE DINITRATE 5 MG PO TABS: 5.0000 mg | ORAL_TABLET | Freq: Three times a day (TID) | ORAL | 1 refills | Status: DC

## 2020-08-30 MED ORDER — ASPIRIN 81 MG PO TBEC: 81.0000 mg | DELAYED_RELEASE_TABLET | Freq: Every day | ORAL | 11 refills | Status: AC

## 2020-08-30 MED ORDER — FUROSEMIDE 40 MG PO TABS: 40.0000 mg | ORAL_TABLET | Freq: Every day | ORAL | 0 refills | Status: DC

## 2020-08-30 MED ORDER — POTASSIUM CHLORIDE CRYS ER 20 MEQ PO TBCR: 20.0000 meq | EXTENDED_RELEASE_TABLET | Freq: Every day | ORAL | 0 refills | Status: DC

## 2020-08-30 MED ORDER — METOPROLOL TARTRATE 25 MG PO TABS: 12.5000 mg | ORAL_TABLET | Freq: Three times a day (TID) | ORAL | 2 refills | Status: DC

## 2020-08-30 MED ORDER — AMIODARONE HCL 200 MG PO TABS: 400.0000 mg | ORAL_TABLET | Freq: Two times a day (BID) | ORAL | 2 refills | Status: DC

## 2020-08-30 MED ORDER — ASPIRIN 325 MG PO TBEC: 325.0000 mg | DELAYED_RELEASE_TABLET | Freq: Every day | ORAL | 0 refills | Status: DC

## 2020-08-30 MED ORDER — TRAMADOL HCL 50 MG PO TABS: 50.0000 mg | ORAL_TABLET | Freq: Four times a day (QID) | ORAL | 0 refills | Status: DC | PRN

## 2020-08-30 MED ORDER — CLOPIDOGREL BISULFATE 75 MG PO TABS: 75.0000 mg | ORAL_TABLET | Freq: Every day | ORAL | 5 refills | Status: DC

## 2020-08-30 MED ORDER — ATORVASTATIN CALCIUM 80 MG PO TABS: 80.0000 mg | ORAL_TABLET | Freq: Every day | ORAL | 2 refills | Status: DC

## 2020-08-30 NOTE — Progress Notes (Signed)
      [address]       [place] [address]             [phone]      6 Days Post-Op Procedure(s) (LRB): CORONARY ARTERY BYPASS GRAFTING (CABG) X3 USING BILATERAL MAMMARY ARTERIES AND RIGHT RADIAL ARTERY (N/A) TRANSESOPHAGEAL ECHOCARDIOGRAM (TEE) (N/A) INDOCYANINE GREEN FLUORESCENCE IMAGING (ICG) (N/A) Subjective: Feels well  Objective: Vital signs in last 24 hours: Temp:  [98.2 F (36.8 C)-98.6 F (37 C)] 98.2 F (36.8 C) (05/23 0442) Pulse Rate:  [62-76] 63 (05/23 0442) Cardiac Rhythm: Normal sinus rhythm (05/22 1942) Resp:  [15-20] 15 (05/23 0442) BP: (97-139)/(68-89) 104/73 (05/23 0442) SpO2:  [96 %-100 %] 96 % (05/23 0442) Weight:  [74.7 kg] 74.7 kg (05/23 0442)  Hemodynamic parameters for last 24 hours:    Intake/Output from previous day: 05/22 0701 - 05/23 0700 In: 980 [P.O.:880; I.V.:100] Out: 1730 [Urine:1730] Intake/Output this shift: Total I/O In: 360 [P.O.:360] Out: 450 [Urine:450]  General appearance: alert, cooperative and no distress Heart: regular rate and rhythm Lungs: mildly dim in bases Abdomen: benign Extremities: + edema Wound: incis healing well, right arm N/V intact  Lab Results: Recent Labs    08/29/20 0106 08/30/20 0203  WBC 16.8* 12.8*  HGB 8.4* 8.1*  HCT 25.6* 25.3*  PLT 280 300   BMET:  Recent Labs    08/29/20 0106 08/30/20 0203  NA 136 137  K 4.2 4.3  CL 105 107  CO2 23 24  GLUCOSE 110* 94  BUN 41* 33*  CREATININE 1.39* 1.36*  CALCIUM 8.1* 8.2*    PT/INR: No results for input(s): LABPROT, INR in the last 72 hours. ABG    Component Value Date/Time   PHART 7.344 (L) 08/24/2020 1810   HCO3 20.0 08/24/2020 1810   TCO2 21 (L) 08/24/2020 1810   ACIDBASEDEF 5.0 (H) 08/24/2020 1810   O2SAT 96.0 08/24/2020 1810   CBG (last 3)  Recent Labs    08/28/20 0332 08/28/20 0802 08/28/20 1150  GLUCAP 114* 182* 126*    Meds Scheduled Meds: . amiodarone  400 mg Oral BID  . aspirin EC  325 mg Oral  Daily  . atorvastatin  80 mg Oral Daily  . clopidogrel  75 mg Oral Daily  . furosemide  40 mg Oral Daily  . isosorbide dinitrate  5 mg Oral TID  . metoprolol tartrate  12.5 mg Oral Q8H  . pantoprazole  40 mg Oral QAC breakfast  . potassium chloride  20 mEq Oral BID  . sodium chloride flush  3 mL Intravenous Q12H   Continuous Infusions: . sodium chloride     PRN Meds:.sodium chloride, acetaminophen, ondansetron **OR** ondansetron (ZOFRAN) IV, oxyCODONE, sodium chloride flush, traMADol  Xrays No results found.  Assessment/Plan: S/P Procedure(s) (LRB): CORONARY ARTERY BYPASS GRAFTING (CABG) X3 USING BILATERAL MAMMARY ARTERIES AND RIGHT RADIAL ARTERY (N/A) TRANSESOPHAGEAL ECHOCARDIOGRAM (TEE) (N/A) INDOCYANINE GREEN FLUORESCENCE IMAGING (ICG) (N/A)  1 afeb, VSS, sinus rhythm, brady 2 sats good on RA 3 renal fxn improving, + edematous, cont lasix outpatient 4 leukocytosis trend improves 5 CBG well controlled 6 home today    LOS: 12 days    Tyler Clack  PA-C Pager [phone] 08/30/2020

## 2020-08-30 NOTE — Progress Notes (Signed)
[id number] Education completed with pt and wife who voiced understanding. Reviewed sternal precautions and staying in the tube, IS, walking for ex, heart healthy and low sodium food choices, smoking cessation, wound care and CRP 2. Gave tobacco cessation handout and encouraged to call 1800quitnow if needed. Discussed keeping feet elevated and watching sodium. Discussed CRP 2 and referred to [place]. Pt ready for discharge. Luetta Nutting RN BSN 08/30/2020 10:15 AM

## 2020-08-30 NOTE — Progress Notes (Signed)
CARDIOLOGY RECOMMENDATIONS:  Discharge is anticipated in the next 48 hours. Recommendations for medications and follow up:  Discharge Medications: ASA 81mg  daily, plavix 75mg  daily, atorvastatin 80mg  daily, metoprolol 12.5mg  q8 hr (consider consolidate to 25mg  BID), Isordil 5mg  TID and amiodarone 400mg  BID  Continue medications as they are currently listed in the [facility]. Exceptions to the above:  None, plan to taper amiodarone in follow up appt  Follow Up: The patient's Primary Cardiologist is , MD  Follow up in the office in 2 week(s). Will arrange for outpatient follow up appt.   Signed,  , NP  8:49 AM 08/30/2020  [facility]

## 2020-09-03 ENCOUNTER — Telehealth (HOSPITAL_COMMUNITY): Payer: Self-pay

## 2020-09-03 NOTE — Telephone Encounter (Signed)
Called and spoke with pt in regards to CR, pt stated he is not interested at this time.   Closed referral 

## 2020-09-03 NOTE — Telephone Encounter (Signed)
Pt insurance is active and benefits verified through [place]. Co-pay $0.00, DED $100.00/$100.00 met, out of pocket $1,000.00/$695.35 met, co-insurance 20%. No pre-authorization required. Passport, 09/03/20 @ 1:55PM, [id number]  Will contact patient to see if he is interested in the Cardiac Rehab Program. If interested, patient will need to complete follow up appt. Once completed, patient will be contacted for scheduling upon review by the RN Navigator.

## 2020-09-14 ENCOUNTER — Other Ambulatory Visit: Payer: Self-pay | Admitting: Cardiothoracic Surgery

## 2020-09-14 DIAGNOSIS — Z951 Presence of aortocoronary bypass graft: Secondary | ICD-10-CM

## 2020-09-16 ENCOUNTER — Other Ambulatory Visit: Payer: Self-pay

## 2020-09-16 ENCOUNTER — Ambulatory Visit (INDEPENDENT_AMBULATORY_CARE_PROVIDER_SITE_OTHER): Payer: Self-pay | Admitting: Cardiothoracic Surgery

## 2020-09-16 ENCOUNTER — Ambulatory Visit
Admission: RE | Admit: 2020-09-16 | Discharge: 2020-09-16 | Disposition: A | Discharge status: Home / Self Care | Payer: BC Managed Care – PPO | Source: Ambulatory Visit | Attending: Cardiothoracic Surgery | Admitting: Cardiothoracic Surgery

## 2020-09-16 VITALS — BP 126/76 | HR 56 | Resp 20 | Ht 70.0 in | Wt 155.0 lb

## 2020-09-16 DIAGNOSIS — Z951 Presence of aortocoronary bypass graft: Secondary | ICD-10-CM

## 2020-09-16 MED ORDER — METOPROLOL TARTRATE 25 MG PO TABS: 12.5000 mg | ORAL_TABLET | Freq: Two times a day (BID) | ORAL | 2 refills | Status: DC

## 2020-09-22 ENCOUNTER — Encounter: Payer: Self-pay | Admitting: Physician Assistant

## 2020-09-22 ENCOUNTER — Ambulatory Visit: Payer: BC Managed Care – PPO | Admitting: Physician Assistant

## 2020-09-22 ENCOUNTER — Other Ambulatory Visit: Payer: Self-pay

## 2020-09-22 VITALS — BP 122/68 | HR 60 | Ht 70.0 in | Wt 157.0 lb

## 2020-09-22 DIAGNOSIS — I255 Ischemic cardiomyopathy: Secondary | ICD-10-CM

## 2020-09-22 DIAGNOSIS — E785 Hyperlipidemia, unspecified: Secondary | ICD-10-CM

## 2020-09-22 DIAGNOSIS — D649 Anemia, unspecified: Secondary | ICD-10-CM

## 2020-09-22 DIAGNOSIS — I25810 Atherosclerosis of coronary artery bypass graft(s) without angina pectoris: Secondary | ICD-10-CM | POA: Diagnosis not present

## 2020-09-22 DIAGNOSIS — N183 Chronic kidney disease, stage 3 unspecified: Secondary | ICD-10-CM | POA: Diagnosis not present

## 2020-09-22 DIAGNOSIS — I480 Paroxysmal atrial fibrillation: Secondary | ICD-10-CM

## 2020-09-22 MED ORDER — LOSARTAN POTASSIUM 25 MG PO TABS: 25.0000 mg | ORAL_TABLET | Freq: Every day | ORAL | 3 refills | Status: DC

## 2020-09-22 MED ORDER — METOPROLOL SUCCINATE ER 25 MG PO TB24
25.0000 mg | ORAL_TABLET | Freq: Every day | ORAL | 3 refills | Status: DC
Start: 2020-09-22 — End: 2021-06-29

## 2020-09-22 MED ORDER — NITROGLYCERIN 0.4 MG SL SUBL: 0.4000 mg | SUBLINGUAL_TABLET | SUBLINGUAL | 3 refills | Status: AC | PRN

## 2020-09-22 NOTE — Patient Instructions (Signed)
Medication Instructions:  STOP Metoprolol Tartrate (Lopressor) START Metoprolol Succinate (Toprol-XL) 25 mg daily TAKE Nitroglycerin 0.4 mg. Place 1 tablet under tongue for chest pain if after 5 minutes pain has not subsided place a 2nd tablet under tongue if after 5 minutes has passed and pain has not subsided place a 3rd tablet under tongue. If after the 3rd tablet and chest pain has not subsided CALL 911  *If you need a refill on your cardiac medications before your next appointment, please call your pharmacy*  Lab Work: Your physician recommends that you return for lab work in 2 weeks:  CBC CMET Fasting Lipid Panel-DO NOT EAT OR DRINK PAST MIDNIGHT. OKAY TO HAVE WATER.  If you have labs (blood work) drawn today and your tests are completely normal, you will receive your results only by: MyChart Message (if you have MyChart) OR A paper copy in the mail If you have any lab test that is abnormal or we need to change your treatment, we will call you to review the results.   Testing/Procedures: NONE ordered at this time of appointment   Follow-Up: At [facility], you and your health needs are our priority.  As part of our continuing mission to provide you with exceptional heart care, we have created designated Provider Care Teams.  These Care Teams include your primary Cardiologist (physician) and Advanced Practice Providers (APPs -  Physician Assistants and Nurse Practitioners) who all work together to provide you with the care you need, when you need it.  We recommend signing up for the patient portal called "MyChart".  Sign up information is provided on this After Visit Summary.  MyChart is used to connect with patients for Virtual Visits (Telemedicine).  Patients are able to view lab/test results, encounter notes, upcoming appointments, etc.  Non-urgent messages can be sent to your provider as well.   To learn more about what you can do with MyChart, go to [email or web address].     Your next appointment:   3 month(s)  The format for your next appointment:   In Person  Provider:   Nicki Guadalajara, MD  Other Instructions

## 2020-09-22 NOTE — Progress Notes (Signed)
Cardiology Office Note:    Date:  09/24/2020   ID:  Tyler Kim, DOB Apr 28, 1955, MRN [id number]  PCP:  Aviva Kluver   [facility] Providers Cardiologist:  Nicki Guadalajara, MD     Referring MD: No ref. provider found   Chief Complaint  Patient presents with   Follow-up    CABG.    History of Present Illness:    Tyler Kim is a 65 y.o. male with a hx of tobacco abuse, hyperlipidemia, postop A. fib, and recently diagnosed CAD s/p CABG x3 on 08/24/2020.  Patient was admitted to the hospital on 08/18/2020 with chest tightness.  He was ruled in for NSTEMI with troponin greater than 27,000 on arrival.  EKG showed acute anterior infarct with T wave inversion but did not meet STEMI criteria, overall consistent with late presenting MI.  He underwent cardiac catheterization on 08/19/2020 that showed severe three-vessel CAD with ulcerated 90% mid LAD stenosis, 90% mid left circumflex stenosis and a sequential 75% mid RCA and proximal RPAV stenosis, LVEDP 25-30 mmHg, EF 30 to 35%.  Postprocedure, patient underwent gentle diuresis.  Echocardiogram obtained on 08/19/2020 showed EF 30 to 35%, swirling of contrast in the apex without evidence of apical thrombus, akinesis of the apex and mid apical anterior septal wall, grade 2 DD.  Presurgical evaluation showed minimal plaque in the carotid artery, normal ABI bilaterally, normal blood flow in bilateral subclavian artery.  Patient ultimately underwent CABG x3 with LIMA to distal LAD, RIMA to PDA, right radial artery graft to OM1 of left circumflex artery by Dr. Augusto Garbe on 08/24/2020.  Postprocedure he was placed on dual antiplatelet therapy and high intensity statin.  He was also placed on low-dose metoprolol.  He developed postop atrial fibrillation with RVR and was placed on IV amiodarone on 08/27/2020.  Chest x-ray obtained on 09/04/2018 also showed stable small left apical pneumothorax.  Patient presents today for posthospital follow-up.  He denies any chest  pain or worsening dyspnea.  Amiodarone, isosorbide dinitrate and Lasix were discontinued during the recent visit with CT surgery.  Metoprolol has been reduced to 12.5 mg twice a day.  Given his LV dysfunction, I recommended consolidate metoprolol tartrate to 25 mg daily of metoprolol succinate.  I also added 25 mg daily of losartan given LV dysfunction.  He will need fasting lipid panel, CMP and a CBC in 2 weeks.  I gave him a prescription for sublingual nitroglycerin.  He can follow-up with Dr. Tresa Endo in 3 months.  I will defer to MD to decide when to repeat echocardiogram.  Otherwise his sternotomy scar seems to be very well-healed.   History reviewed. No pertinent past medical history.  Past Surgical History:  Procedure Laterality Date   CORONARY ARTERY BYPASS GRAFT N/A 08/24/2020   Procedure: CORONARY ARTERY BYPASS GRAFTING (CABG) X3 USING BILATERAL MAMMARY ARTERIES AND RIGHT RADIAL ARTERY;  Surgeon: Linden Dolin, MD;  Location: [facility] OR;  Service: Open Heart Surgery;  Laterality: N/A;   LEFT HEART CATH AND CORONARY ANGIOGRAPHY N/A 08/19/2020   Procedure: LEFT HEART CATH AND CORONARY ANGIOGRAPHY;  Surgeon: Yvonne Kendall, MD;  Location: [facility] INVASIVE CV LAB;  Service: Cardiovascular;  Laterality: N/A;   TEE WITHOUT CARDIOVERSION N/A 08/24/2020   Procedure: TRANSESOPHAGEAL ECHOCARDIOGRAM (TEE);  Surgeon: Linden Dolin, MD;  Location: [facility];  Service: Open Heart Surgery;  Laterality: N/A;    Current Medications: Current Meds  Medication Sig   acetaminophen (TYLENOL) 325 MG tablet Take 2 tablets (650 mg total)  by mouth every 6 (six) hours as needed for mild pain.   Ascorbic Acid (VITAMIN C) 1000 MG tablet Take 1,000 mg by mouth daily.   aspirin EC 81 MG EC tablet Take 1 tablet (81 mg total) by mouth daily. Swallow whole.   atorvastatin (LIPITOR) 80 MG tablet Take 1 tablet (80 mg total) by mouth daily.   cholecalciferol (VITAMIN D3) 25 MCG (1000 UNIT) tablet Take 1,000 Units by mouth daily.    clopidogrel (PLAVIX) 75 MG tablet Take 1 tablet (75 mg total) by mouth daily.   losartan (COZAAR) 25 MG tablet Take 1 tablet (25 mg total) by mouth daily.   metoprolol succinate (TOPROL-XL) 25 MG 24 hr tablet Take 1 tablet (25 mg total) by mouth daily.   nitroGLYCERIN (NITROSTAT) 0.4 MG SL tablet Place 1 tablet (0.4 mg total) under the tongue every 5 (five) minutes as needed for chest pain.   Omega-3 Fatty Acids (FISH OIL) 1000 MG CAPS Take 1 capsule by mouth daily.   vitamin B-12 (CYANOCOBALAMIN) 1000 MCG tablet Take 1,000 mcg by mouth daily.   vitamin E 180 MG (400 UNITS) capsule Take 400 Units by mouth daily.   [DISCONTINUED] metoprolol tartrate (LOPRESSOR) 25 MG tablet Take 0.5 tablets (12.5 mg total) by mouth 2 (two) times daily.     Allergies:   Patient has no known allergies.   Social History   Socioeconomic History   Marital status: Married    Spouse name: Not on file   Number of children: Not on file   Years of education: Not on file   Highest education level: Not on file  Occupational History   Not on file  Tobacco Use   Smoking status: Every Day    Packs/day: 2.00    Pack years: 0.00    Types: Cigarettes   Smokeless tobacco: Never  Substance and Sexual Activity   Alcohol use: Not Currently   Drug use: Not Currently   Sexual activity: Not on file  Other Topics Concern   Not on file  Social History Narrative   Not on file   Social Determinants of Health   Financial Resource Strain: Not on file  Food Insecurity: Not on file  Transportation Needs: Not on file  Physical Activity: Not on file  Stress: Not on file  Social Connections: Not on file     Family History: The patient's family history is not on file.  ROS:   Please see the history of present illness.     All other systems reviewed and are negative.  EKGs/Labs/Other Studies Reviewed:    The following studies were reviewed today:  Echo 08/19/2020 1. Definity contrast was used to image the LV .  The apex and mid- apical  anterior septal walls are akinetic. There is swirling of contrast but no  evidence of apical thrombi. . Left ventricular ejection fraction, by  estimation, is 30 to 35%. The left  ventricle has moderately decreased function. The left ventricle  demonstrates regional wall motion abnormalities (see scoring  diagram/findings for description). Left ventricular diastolic parameters  are consistent with Grade II diastolic dysfunction  (pseudonormalization). There is severe akinesis of the left ventricular,  mid-apical anteroseptal wall and apical segment.   2. Right ventricular systolic function is normal. The right ventricular  size is normal.   3. The mitral valve is grossly normal. Trivial mitral valve  regurgitation.   4. The aortic valve is grossly normal. Aortic valve regurgitation is not  visualized.  Cath 08/19/2020 Conclusions: Severe three vessel-coronary artery disease, including ulcerated 90% mid LAD stenosis, 90% mid LCx lesion, and sequential 75% mid RCA and proximal rPAV stenoses. Moderately elevated left ventricular filling pressure (LVEDP 25-30 mmHg) with echo earlier today showing LVEF 30-35%.   Recommendations: Cardiac surgery consultation for CABG. Restart IV heparin 2 hours after TR band removal. Aggressive secondary prevention of coronary artery disease; defer adding P2Y12 inhibitor pending cardiac surgery evaluation. Gentle diuresis; will start furosemide 20 mg IV daily with further titration as needed. Escalate goal-directed medical therapy for acute HFrEF, as blood pressure, heart rate, and renal function allow.   EKG:  EKG is ordered today.  The ekg ordered today demonstrates normal sinus rhythm with T wave inversion in the anterior leads.  Recent Labs: 08/21/2020: B Natriuretic Peptide 279.1; TSH 1.694 08/25/2020: ALT 26 08/29/2020: Magnesium 2.3 08/30/2020: BUN 33; Creatinine, Ser 1.36; Hemoglobin 8.1; Platelets 300; Potassium 4.3;  Sodium 137  Recent Lipid Panel    Component Value Date/Time   CHOL 213 (H) 08/19/2020 0415   TRIG 114 08/19/2020 0415   HDL 38 (L) 08/19/2020 0415   CHOLHDL 5.6 08/19/2020 0415   VLDL 23 08/19/2020 0415   LDLCALC 152 (H) 08/19/2020 0415     Risk Assessment/Calculations:    CHA2DS2-VASc Score = 3  This indicates a 3.2% annual risk of stroke. The patient's score is based upon: CHF History: Yes HTN History: Yes Diabetes History: No Stroke History: No Vascular Disease History: Yes Age Score: 0 Gender Score: 0     Physical Exam:    VS:  BP 122/68 (BP Location: Left Arm, Patient Position: Sitting, Cuff Size: Normal)   Pulse 60   Ht 5\' 10"  (1.778 m)   Wt 157 lb (71.2 kg)   BMI 22.53 kg/m     Wt Readings from Last 3 Encounters:  09/22/20 157 lb (71.2 kg)  09/16/20 155 lb (70.3 kg)  08/30/20 164 lb 10.9 oz (74.7 kg)     GEN:  Well nourished, well developed in no acute distress HEENT: Normal NECK: No JVD; No carotid bruits LYMPHATICS: No lymphadenopathy CARDIAC: RRR, no murmurs, rubs, gallops RESPIRATORY:  Clear to auscultation without rales, wheezing or rhonchi  ABDOMEN: Soft, non-tender, non-distended MUSCULOSKELETAL:  No edema; No deformity  SKIN: Warm and dry NEUROLOGIC:  Alert and oriented x 3 PSYCHIATRIC:  Normal affect   ASSESSMENT:    1. Coronary artery disease involving coronary bypass graft of native heart without angina pectoris   2. Hyperlipidemia, unspecified hyperlipidemia type   3. Anemia, unspecified type   4. Stage 3 chronic kidney disease, unspecified whether stage 3a or 3b CKD (HCC)   5. Ischemic cardiomyopathy   6. PAF (paroxysmal atrial fibrillation) (HCC)    PLAN:    In order of problems listed above:  CAD s/p CABG: Sternotomy is well-healed.  Continue aspirin and Lipitor  Hyperlipidemia: On Lipitor 80 mg daily.  Fasting lipid panel and CMP in 6 to 8 weeks  Anemia: Obtain CBC  CKD stage III: Complete metabolic panel  Ischemic  cardiomyopathy: Consolidate metoprolol tartrate to metoprolol succinate.  Add low-dose losartan 25 mg daily  Postop atrial fibrillation: No recurrence.  Amiodarone has been discontinued.   Medication Adjustments/Labs and Tests Ordered: Current medicines are reviewed at length with the patient today.  Concerns regarding medicines are outlined above.  Orders Placed This Encounter  Procedures   Lipid panel   Comprehensive metabolic panel   CBC   EKG 12-Lead   Meds ordered this  encounter  Medications   metoprolol succinate (TOPROL-XL) 25 MG 24 hr tablet    Sig: Take 1 tablet (25 mg total) by mouth daily.    Dispense:  90 tablet    Refill:  3   losartan (COZAAR) 25 MG tablet    Sig: Take 1 tablet (25 mg total) by mouth daily.    Dispense:  90 tablet    Refill:  3   nitroGLYCERIN (NITROSTAT) 0.4 MG SL tablet    Sig: Place 1 tablet (0.4 mg total) under the tongue every 5 (five) minutes as needed for chest pain.    Dispense:  25 tablet    Refill:  3    Patient Instructions  Medication Instructions:  STOP Metoprolol Tartrate (Lopressor) START Metoprolol Succinate (Toprol-XL) 25 mg daily TAKE Nitroglycerin 0.4 mg. Place 1 tablet under tongue for chest pain if after 5 minutes pain has not subsided place a 2nd tablet under tongue if after 5 minutes has passed and pain has not subsided place a 3rd tablet under tongue. If after the 3rd tablet and chest pain has not subsided CALL 911  *If you need a refill on your cardiac medications before your next appointment, please call your pharmacy*  Lab Work: Your physician recommends that you return for lab work in 2 weeks:  CBC CMET Fasting Lipid Panel-DO NOT EAT OR DRINK PAST MIDNIGHT. OKAY TO HAVE WATER.  If you have labs (blood work) drawn today and your tests are completely normal, you will receive your results only by: MyChart Message (if you have MyChart) OR A paper copy in the mail If you have any lab test that is abnormal or we need  to change your treatment, we will call you to review the results.   Testing/Procedures: NONE ordered at this time of appointment   Follow-Up: [facility], you and your health needs are our priority.  As part of our continuing mission to provide you with exceptional heart care, we have created designated Provider Care Teams.  These Care Teams include your primary Cardiologist (physician) and Advanced Practice Providers (APPs -  Physician Assistants and Nurse Practitioners) who all work together to provide you with the care you need, when you need it.  We recommend signing up for the patient portal called "MyChart".  Sign up information is provided on this After Visit Summary.  MyChart is used to connect with patients for Virtual Visits (Telemedicine).  Patients are able to view lab/test results, encounter notes, upcoming appointments, etc.  Non-urgent messages can be sent to your provider as well.   To learn more about what you can do with MyChart, go to [email or web address].    Your next appointment:   3 month(s)  The format for your next appointment:   In Person  Provider:   Nicki Guadalajarahomas Kelly, MD  Other Instructions    Signed, Azalee CourseHao Santonio Speakman, PA  09/24/2020 11:05 PM    [facility]

## 2020-10-01 NOTE — Progress Notes (Signed)
[address]       [place] [address]             [phone]     CARDIOTHORACIC SURGERY OFFICE NOTE  Referring Provider is End, Cristal Deer, MD Primary Cardiologist is Nicki Guadalajara, MD PCP is Pcp, No   HPI:  65 year old man is seen for his initial postop visit status post CABG on 08/24/2020.  He did well after surgery was discharged just a few days later.  He has no complaints at this point.  He denies angina or chest pain.  His energy level is improving.  He has a good appetite and is not losing weight.  He is eager to travel and resume his normal lifestyle later this summer   Current Outpatient Medications  Medication Sig Dispense Refill   acetaminophen (TYLENOL) 325 MG tablet Take 2 tablets (650 mg total) by mouth every 6 (six) hours as needed for mild pain.     Ascorbic Acid (VITAMIN C) 1000 MG tablet Take 1,000 mg by mouth daily.     aspirin EC 81 MG EC tablet Take 1 tablet (81 mg total) by mouth daily. Swallow whole. 30 tablet 11   atorvastatin (LIPITOR) 80 MG tablet Take 1 tablet (80 mg total) by mouth daily. 30 tablet 2   cholecalciferol (VITAMIN D3) 25 MCG (1000 UNIT) tablet Take 1,000 Units by mouth daily.     clopidogrel (PLAVIX) 75 MG tablet Take 1 tablet (75 mg total) by mouth daily. 30 tablet 5   losartan (COZAAR) 25 MG tablet Take 1 tablet (25 mg total) by mouth daily. 90 tablet 3   metoprolol succinate (TOPROL-XL) 25 MG 24 hr tablet Take 1 tablet (25 mg total) by mouth daily. 90 tablet 3   nitroGLYCERIN (NITROSTAT) 0.4 MG SL tablet Place 1 tablet (0.4 mg total) under the tongue every 5 (five) minutes as needed for chest pain. 25 tablet 3   Omega-3 Fatty Acids (FISH OIL) 1000 MG CAPS Take 1 capsule by mouth daily.     vitamin B-12 (CYANOCOBALAMIN) 1000 MCG tablet Take 1,000 mcg by mouth daily.     vitamin E 180 MG (400 UNITS) capsule Take 400 Units by mouth daily.     No current facility-administered medications for this visit.       Physical Exam:   BP 126/76   Pulse (!) 56   Resp 20   Ht 5\' 10"  (1.778 m)   Wt 70.3 kg   SpO2 96% Comment: RA  BMI 22.24 kg/m   General:  Well-appearing no acute distress  Chest:   Clear to auscultation bilaterally  CV:   Regular rate and rhythm no murmur  Incisions:  Healing well  Abdomen:  Soft nontender  Extremities:  No edema  Diagnostic Tests:  I personally reviewed his available imaging studies including chest x-ray from 09/16/2020 which demonstrates clear lung fields and stable mediastinum   Impression:  Doing well after CABG  Plan:  Follow-up as needed Okay to drive in 1 to 2 weeks  I spent in excess of 15 minutes during the conduct of this office consultation and >50% of this time involved direct face-to-face encounter with the patient for counseling and/or coordination of their care.  Level 2                 10 minutes Level 3                 15 minutes Level 4  25 minutes Level 5                 40 minutes  B.  Lorayne Marek, MD 10/01/2020 3:18 PM

## 2020-10-07 LAB — LIPID PANEL
Chol/HDL Ratio: 3.3 ratio (ref 0.0–5.0)
Cholesterol, Total: 122 mg/dL (ref 100–199)
HDL: 37 mg/dL — ABNORMAL LOW (ref >39)
LDL Chol Calc (NIH): 60 mg/dL (ref 0–99)
Triglycerides: 140 mg/dL (ref 0–149)
VLDL Cholesterol Cal: 25 mg/dL (ref 5–40)

## 2020-10-07 LAB — COMPREHENSIVE METABOLIC PANEL
ALT: 30 IU/L (ref 0–44)
AST: 26 IU/L (ref 0–40)
Albumin/Globulin Ratio: 2.1 (ref 1.2–2.2)
Albumin: 5.1 g/dL — ABNORMAL HIGH (ref 3.8–4.8)
Alkaline Phosphatase: 115 IU/L (ref 44–121)
BUN/Creatinine Ratio: 19 (ref 10–24)
BUN: 27 mg/dL (ref 8–27)
Bilirubin Total: 0.3 mg/dL (ref 0.0–1.2)
CO2: 19 mmol/L — ABNORMAL LOW (ref 20–29)
Calcium: 9.9 mg/dL (ref 8.6–10.2)
Chloride: 103 mmol/L (ref 96–106)
Creatinine, Ser: 1.39 mg/dL — ABNORMAL HIGH (ref 0.76–1.27)
Globulin, Total: 2.4 g/dL (ref 1.5–4.5)
Glucose: 98 mg/dL (ref 65–99)
Potassium: 5.6 mmol/L — ABNORMAL HIGH (ref 3.5–5.2)
Sodium: 137 mmol/L (ref 134–144)
Total Protein: 7.5 g/dL (ref 6.0–8.5)
eGFR: 57 mL/min/{1.73_m2} — ABNORMAL LOW (ref >59)

## 2020-10-07 LAB — CBC
Hematocrit: 41.1 % (ref 37.5–51.0)
Hemoglobin: 13.3 g/dL (ref 13.0–17.7)
MCH: 29.4 pg (ref 26.6–33.0)
MCHC: 32.4 g/dL (ref 31.5–35.7)
MCV: 91 fL (ref 79–97)
Platelets: 279 10*3/uL (ref 150–450)
RBC: 4.52 x10E6/uL (ref 4.14–5.80)
RDW: 13.5 % (ref 11.6–15.4)
WBC: 9.5 10*3/uL (ref 3.4–10.8)

## 2020-10-08 ENCOUNTER — Telehealth: Payer: Self-pay

## 2020-10-08 NOTE — Progress Notes (Signed)
Red blood cell count now within normal range. Stable renal function. Potassium went up, now in high range, only medication that can raise potassium is the losartan. Recommend discontinue losartan for now. Repeat BMET in 1-2 weeks. Cholesterol well controlled.

## 2020-10-08 NOTE — Telephone Encounter (Addendum)
Left a voice message for the patient to give office a call back for results. I will try calling patient again.  ----- Message from Azalee Course, [place] sent at 10/08/2020  2:24 PM EDT ----- Red blood cell count now within normal range. Stable renal function. Potassium went up, now in high range, only medication that can raise potassium is the losartan. Recommend discontinue losartan for now. Repeat BMET in 1-2 weeks. Cholesterol well co ntrolled.

## 2020-10-15 NOTE — Telephone Encounter (Signed)
Called and left a voice message for the patient to please give the office a call for results and medication changes. Will try calling patient again

## 2020-10-19 ENCOUNTER — Other Ambulatory Visit: Payer: Self-pay

## 2020-10-19 DIAGNOSIS — Z79899 Other long term (current) drug therapy: Secondary | ICD-10-CM

## 2020-10-19 NOTE — Telephone Encounter (Signed)
Spoke to patient he stated he is not taking Losartan.Stated Losartan was not on his list. He will repeat bmet in 1 to 2 weeks.

## 2020-10-19 NOTE — Addendum Note (Signed)
Addended by: Neoma Laming on: 10/19/2020 11:11 AM   Modules accepted: Orders

## 2020-10-19 NOTE — Telephone Encounter (Signed)
Pt is returning call.  

## 2020-10-23 ENCOUNTER — Other Ambulatory Visit: Payer: Self-pay | Admitting: Surgical

## 2020-11-02 LAB — BASIC METABOLIC PANEL
BUN/Creatinine Ratio: 20 (ref 10–24)
BUN: 30 mg/dL — ABNORMAL HIGH (ref 8–27)
CO2: 22 mmol/L (ref 20–29)
Calcium: 9.8 mg/dL (ref 8.6–10.2)
Chloride: 104 mmol/L (ref 96–106)
Creatinine, Ser: 1.47 mg/dL — ABNORMAL HIGH (ref 0.76–1.27)
Glucose: 94 mg/dL (ref 65–99)
Potassium: 4.9 mmol/L (ref 3.5–5.2)
Sodium: 141 mmol/L (ref 134–144)
eGFR: 53 mL/min/{1.73_m2} — ABNORMAL LOW (ref >59)

## 2020-11-30 ENCOUNTER — Other Ambulatory Visit: Payer: Self-pay | Admitting: Surgical

## 2020-11-30 ENCOUNTER — Other Ambulatory Visit: Payer: Self-pay | Admitting: Cardiovascular Disease

## 2020-12-19 ENCOUNTER — Other Ambulatory Visit: Payer: Self-pay | Admitting: Surgical

## 2021-01-19 ENCOUNTER — Encounter: Payer: Self-pay | Admitting: Cardiovascular Disease

## 2021-01-19 ENCOUNTER — Other Ambulatory Visit: Payer: Self-pay

## 2021-01-19 ENCOUNTER — Ambulatory Visit (INDEPENDENT_AMBULATORY_CARE_PROVIDER_SITE_OTHER): Payer: BC Managed Care – PPO | Admitting: Cardiovascular Disease

## 2021-01-19 DIAGNOSIS — I255 Ischemic cardiomyopathy: Secondary | ICD-10-CM

## 2021-01-19 DIAGNOSIS — Z720 Tobacco use: Secondary | ICD-10-CM

## 2021-01-19 DIAGNOSIS — E785 Hyperlipidemia, unspecified: Secondary | ICD-10-CM

## 2021-01-19 DIAGNOSIS — Z951 Presence of aortocoronary bypass graft: Secondary | ICD-10-CM

## 2021-01-19 DIAGNOSIS — I2510 Atherosclerotic heart disease of native coronary artery without angina pectoris: Secondary | ICD-10-CM | POA: Diagnosis not present

## 2021-01-19 DIAGNOSIS — I2109 ST elevation (STEMI) myocardial infarction involving other coronary artery of anterior wall: Secondary | ICD-10-CM

## 2021-01-19 DIAGNOSIS — I25810 Atherosclerosis of coronary artery bypass graft(s) without angina pectoris: Secondary | ICD-10-CM

## 2021-01-19 NOTE — Patient Instructions (Addendum)
Medication Instructions:  No Changes In Medications at this time.  *If you need a refill on your cardiac medications before your next appointment, please call your pharmacy*  Lab Work: PLEASE RETURN FOR FASTING LABS WHEN ABLE. LAB IS OPEN Monday-Friday from 8AM-4PM, NO APPOINTMENT IS NEEDED  If you have labs (blood work) drawn today and your tests are completely normal, you will receive your results only by: MyChart Message (if you have MyChart) OR A paper copy in the mail If you have any lab test that is abnormal or we need to change your treatment, we will call you to review the results.  Testing/Procedures: Your physician has requested that you have an echocardiogram IN [place] OF 2023. Echocardiography is a painless test that uses sound waves to create images of your heart. It provides your doctor with information about the size and shape of your heart and how well your heart's chambers and valves are working. You may receive an ultrasound enhancing agent through an IV if needed to better visualize your heart during the echo.This procedure takes approximately one hour. There are no restrictions for this procedure. This will take place at the [address], [address].   Follow-Up: At [facility], you and your health needs are our priority.  As part of our continuing mission to provide you with exceptional heart care, we have created designated Provider Care Teams.  These Care Teams include your primary Cardiologist (physician) and Advanced Practice Providers (APPs -  Physician Assistants and Nurse Practitioners) who all work together to provide you with the care you need, when you need it.  We recommend signing up for the patient portal called "MyChart".  Sign up information is provided on this After Visit Summary.  MyChart is used to connect with patients for Virtual Visits (Telemedicine).  Patients are able to view lab/test results, encounter notes, upcoming appointments, etc.   Non-urgent messages can be sent to your provider as well.   To learn more about what you can do with MyChart, go to [email or web address].    Your next appointment:   [facility] OF 2023  The format for your next appointment:   In Person  Provider:   Nicki Guadalajara, MD  We discussed this was not done with good denies acute  Check

## 2021-01-19 NOTE — Progress Notes (Signed)
Cardiology Office Note    Date:  01/21/2021   ID:  Tyler Kim, DOB 11/22/1955, MRN [id number]  PCP:  Merryl Hacker, No  Cardiologist:  Shelva Majestic, MD   Initial office evaluation with me following his May 2022 hospitalization   History of Present Illness:  Tyler Kim is a 65 y.o. male who I had seen on 1 occasion during his hospitalization in May 2022.  He developed new onset chest and possible digestive symptoms on Aug 17, 2020.  He initially presented to urgent care and was recommended to go to [facility] for further evaluation.  His ECG suggested a late presentation anterior myocardial infarction and his symptoms were intermittent episodes of "indigestion."  His ECG showed QS complex anteriorly with drip and troponins were greater than 27,000.  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  An echo Doppler study showed an EF in the 30 to 35% range with significant wall motion abnormality involving the mid anterior anteroseptal wall and apex.  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  He ultimately was evaluated by Dr. Julien Girt and underwent successful CABG revascularization surgery on Aug 24, 2020 with a LIMA to his distal LAD, RIMA to PDA, and right radial artery graft to OM1 of the circumflex vessel.  Post procedure he was placed on dual antiplatelet therapy and high intensity statin as well as low-dose metoprolol.  He developed postoperative atrial fibrillation with RVR and was initially treated with IV amiodarone.  Chest x-ray showed small left apical pneumothorax which stabilized.  Following hospital discharge, he was seen by Almyra Deforest, [place] on September 22, 2020.  Prior to that evaluation, his amiodarone, isosorbide, and Lasix were discontinued during the visit with CT surgery.  His metoprolol has been reduced to 12.5 mg twice a day and when seen by help this was consolidated to metoprolol succinate 25 mg  daily.  He also added losartan 25 mg daily.  Presently, he feels well.  He denies any recurrent episodes of chest pain or shortness of breath.  Prior to surgery he had smoked 2 to 3 packs of cigarettes per day for many years and presently he states he is smoking 2 to 3 cigarettes/day.  He exercises regularly and rides a bike, hikes, cuts his grass and feels well without chest pain or exertional dyspnea.  He has not had recent laboratory.  He presents for his initial office evaluation with me.   Past Surgical History:  Procedure Laterality Date   CORONARY ARTERY BYPASS GRAFT N/A 08/24/2020   Procedure: CORONARY ARTERY BYPASS GRAFTING (CABG) X3 USING BILATERAL MAMMARY ARTERIES AND RIGHT RADIAL ARTERY;  Surgeon: Wonda Olds, MD;  Location: [place];  Service: Open Heart Surgery;  Laterality: N/A;   LEFT HEART CATH AND CORONARY ANGIOGRAPHY N/A 08/19/2020   Procedure: LEFT HEART CATH AND CORONARY ANGIOGRAPHY;  Surgeon: Nelva Bush, MD;  Location: [place] [facility];  Service: Cardiovascular;  Laterality: N/A;   TEE WITHOUT CARDIOVERSION N/A 08/24/2020   Procedure: TRANSESOPHAGEAL ECHOCARDIOGRAM (TEE);  Surgeon: Wonda Olds, MD;  Location: [place];  Service: Open Heart Surgery;  Laterality: N/A;    Current Medications: Outpatient Medications Prior to Visit  Medication Sig Dispense Refill   acetaminophen (TYLENOL) 325 MG tablet Take 2 tablets (650 mg total) by mouth every 6 (six) hours as needed for mild pain.     Ascorbic Acid (VITAMIN C) 1000 MG tablet Take 1,000 mg by mouth daily.  aspirin EC 81 MG EC tablet Take 1 tablet (81 mg total) by mouth daily. Swallow whole. 30 tablet 11   atorvastatin (LIPITOR) 80 MG tablet TAKE 1 TABLET BY MOUTH EVERY DAY 30 tablet 2   cholecalciferol (VITAMIN D3) 25 MCG (1000 UNIT) tablet Take 1,000 Units by mouth daily.     clopidogrel (PLAVIX) 75 MG tablet Take 1 tablet (75 mg total) by mouth daily. 30 tablet 5   metoprolol succinate (TOPROL-XL) 25 MG 24  hr tablet Take 1 tablet (25 mg total) by mouth daily. 90 tablet 3   nitroGLYCERIN (NITROSTAT) 0.4 MG SL tablet Place 1 tablet (0.4 mg total) under the tongue every 5 (five) minutes as needed for chest pain. 25 tablet 3   Omega-3 Fatty Acids (FISH OIL) 1000 MG CAPS Take 1 capsule by mouth daily.     vitamin B-12 (CYANOCOBALAMIN) 1000 MCG tablet Take 1,000 mcg by mouth daily.     vitamin E 180 MG (400 UNITS) capsule Take 400 Units by mouth daily.     No facility-administered medications prior to visit.     Allergies:   Patient has no known allergies.   Social History   Socioeconomic History   Marital status: Married    Spouse name: Not on file   Number of children: Not on file   Years of education: Not on file   Highest education level: Not on file  Occupational History   Not on file  Tobacco Use   Smoking status: Every Day    Packs/day: 2.00    Types: Cigarettes   Smokeless tobacco: Never  Substance and Sexual Activity   Alcohol use: Not Currently   Drug use: Not Currently   Sexual activity: Not on file  Other Topics Concern   Not on file  Social History Narrative   Not on file   Social Determinants of Health   Financial Resource Strain: Not on file  Food Insecurity: Not on file  Transportation Needs: Not on file  Physical Activity: Not on file  Stress: Not on file  Social Connections: Not on file     Family History: He denies any family history for cardiovascular issues or diabetes mellitus  ROS General: Negative; No fevers, chills, or night sweats;  HEENT: Negative; No changes in vision or hearing, sinus congestion, difficulty swallowing Pulmonary: Negative; No cough, wheezing, shortness of breath, hemoptysis Cardiovascular: Negative; No chest pain, presyncope, syncope, palpitations GI: Negative; No nausea, vomiting, diarrhea, or abdominal pain GU: Negative; No dysuria, hematuria, or difficulty voiding Musculoskeletal: Negative; no myalgias, joint pain, or  weakness Hematologic/Oncology: Negative; no easy bruising, bleeding Endocrine: Negative; no heat/cold intolerance; no diabetes Neuro: Negative; no changes in balance, headaches Skin: Negative; No rashes or skin lesions Psychiatric: Negative; No behavioral problems, depression Sleep: Negative; No snoring, daytime sleepiness, hypersomnolence, bruxism, restless legs, hypnogognic hallucinations, no cataplexy Other comprehensive 14 point system review is negative.   PHYSICAL EXAM:   VS:  BP 130/80 (BP Location: Left Arm, Patient Position: Sitting, Cuff Size: Normal)   Pulse (!) 57   Ht _0  (1.778 m)   Wt 159 lb 9.6 oz (72.4 kg)   SpO2 98%   BMI 22.90 kg/m     Repeat blood pressure by me was 122/74  Wt Readings from Last 3 Encounters:  01/19/21 159 lb 9.6 oz (72.4 kg)  09/22/20 157 lb (71.2 kg)  09/16/20 155 lb (70.3 kg)    General: Alert, oriented, no distress.  Skin: normal turgor, no rashes, warm  and dry HEENT: Normocephalic, atraumatic. Pupils equal round and reactive to light; sclera anicteric; extraocular muscles intact; Nose without nasal septal hypertrophy Mouth/Parynx benign; Mallinpatti scale 3 Neck: No JVD, no carotid bruits; normal carotid upstroke Lungs: clear to ausculatation and percussion; no wheezing or rales Chest wall: without tenderness to palpitation Heart: PMI not displaced, RRR, s1 s2 normal, 1/6 systolic murmur, no diastolic murmur, no rubs, gallops, thrills, or heaves Abdomen: soft, nontender; no hepatosplenomehaly, BS+; abdominal aorta nontender and not dilated by palpation. Back: no CVA tenderness Pulses 2+ right radial artery harvested in the is for bypass Musculoskeletal: full range of motion, normal strength, no joint deformities Extremities: no clubbing cyanosis or edema, Homan's sign negative  Neurologic: grossly nonfocal; Cranial nerves grossly wnl Psychologic: Normal mood and affect   Studies/Labs Reviewed:   EKG:  EKG is ordered today.   ECG (independently read by me):  Sinus bradycardia at 57, QS v1-3, t v1-6, I Avl  Recent Labs: BMP Latest Ref Rng & Units 11/02/2020 10/07/2020 08/30/2020  Glucose 65 - 99 mg/dL 94 98 94  BUN 8 - 27 mg/dL 30(H) 27 33(H)  Creatinine 0.76 - 1.27 mg/dL 1.47(H) 1.39(H) 1.36(H)  BUN/Creat Ratio 10 - _0 -  Sodium 134 - 144 mmol/L 141 137 137  Potassium 3.5 - 5.2 mmol/L 4.9 5.6(H) 4.3  Chloride 96 - 106 mmol/L 104 103 107  CO2 20 - 29 mmol/L 22 19(L) 24  Calcium 8.6 - 10.2 mg/dL 9.8 9.9 8.2(L)     Hepatic Function Latest Ref Rng & Units 10/07/2020 08/25/2020 08/24/2020  Total Protein 6.0 - 8.5 g/dL 7.5 5.0(L) 5.8(L)  Albumin 3.8 - 4.8 g/dL 5.1(H) 3.1(L) 2.9(L)  AST 0 - 40 IU/L 26 39 40  ALT 0 - 44 IU/L 30 26 42  Alk Phosphatase 44 - 121 IU/L 115 40 65  Total Bilirubin 0.0 - 1.2 mg/dL 0.3 0.7 0.6    CBC Latest Ref Rng & Units 10/07/2020 08/30/2020 08/29/2020  WBC 3.4 - 10.8 x10E3/uL 9.5 12.8(H) 16.8(H)  Hemoglobin 13.0 - 17.7 g/dL 13.3 8.1(L) 8.4(L)  Hematocrit 37.5 - 51.0 % 41.1 25.3(L) 25.6(L)  Platelets 150 - 450 x10E3/uL 279 300 280   Lab Results  Component Value Date   MCV 91 10/07/2020   MCV 92.7 08/30/2020   MCV 91.8 08/29/2020   Lab Results  Component Value Date   TSH 1.694 08/21/2020   Lab Results  Component Value Date   HGBA1C 5.7 (H) 08/18/2020     BNP    Component Value Date/Time   BNP 279.1 (H) 08/21/2020 0900    ProBNP No results found for: PROBNP   Lipid Panel     Component Value Date/Time   CHOL 122 10/07/2020 0856   TRIG 140 10/07/2020 0856   HDL 37 (L) 10/07/2020 0856   CHOLHDL 3.3 10/07/2020 0856   CHOLHDL 5.6 08/19/2020 0415   VLDL 23 08/19/2020 0415   LDLCALC 60 10/07/2020 0856   LABVLDL 25 10/07/2020 0856     RADIOLOGY: No results found.   Additional studies/ records that were reviewed today include:    ECHO:  Aug 19, 2020 IMPRESSIONS   1. Definity contrast was used to image the LV . The apex and mid- apical  anterior septal  walls are akinetic. There is swirling of contrast but no  evidence of apical thrombi. . Left ventricular ejection fraction, by  estimation, is 30 to 35%. The left  ventricle has moderately decreased function. The left ventricle  demonstrates  regional wall motion abnormalities (see scoring  diagram/findings for description). Left ventricular diastolic parameters  are consistent with Grade II diastolic dysfunction  (pseudonormalization). There is severe akinesis of the left ventricular,  mid-apical anteroseptal wall and apical segment.   2. Right ventricular systolic function is normal. The right ventricular  size is normal.   3. The mitral valve is grossly normal. Trivial mitral valve  regurgitation.   4. The aortic valve is grossly normal. Aortic valve regurgitation is not  visualized.   FINDINGS   Left Ventricle: Definity contrast was used to image the LV . The apex and  mid- apical anterior septal walls are akinetic. There is swirling of  contrast but no evidence of apical thrombi. Left ventricular ejection  fraction, by estimation, is 30 to 35%.  The left ventricle has moderately decreased function. The left ventricle  demonstrates regional wall motion abnormalities. Severe akinesis of the  left ventricular, mid-apical anteroseptal wall and apical segment. The  left ventricular internal cavity size  was normal in size. There is no left ventricular hypertrophy. Left  ventricular diastolic parameters are consistent with Grade II diastolic  dysfunction (pseudonormalization).   Right Ventricle: The right ventricular size is normal. No increase in  right ventricular wall thickness. Right ventricular systolic function is  normal.   Left Atrium: Left atrial size was normal in size.   Right Atrium: Right atrial size was normal in size.   Pericardium: There is no evidence of pericardial effusion.   Mitral Valve: The mitral valve is grossly normal. Trivial mitral valve   regurgitation.   Tricuspid Valve: The tricuspid valve is normal in structure. Tricuspid  valve regurgitation is trivial.   Aortic Valve: The aortic valve is grossly normal. Aortic valve  regurgitation is not visualized. Aortic valve mean gradient measures 4.0  mmHg. Aortic valve peak gradient measures 6.6 mmHg. Aortic valve area, by  VTI measures 2.90 cm.   Pulmonic Valve: The pulmonic valve was normal in structure. Pulmonic valve  regurgitation is not visualized.   Aorta: The aortic root and ascending aorta are structurally normal, with  no evidence of dilitation.   IAS/Shunts: The atrial septum is grossly normal.      LEFT VENTRICLE  PLAX 2D  LVIDd:         4.10 cm  Diastology  LVIDs:         2.70 cm  LV e' medial:    7.40 cm/s  LV PW:         1.40 cm  LV E/e' medial:  11.8  LV IVS:        1.50 cm  LV e' lateral:   9.68 cm/s  LVOT diam:     2.00 cm  LV E/e' lateral: 9.0  LV SV:         69  LV SV Index:   36  LVOT Area:     3.14 cm      RIGHT VENTRICLE             IVC  RV Basal diam:  3.00 cm     IVC diam: 1.60 cm  RV S prime:     11.70 cm/s  TAPSE (M-mode): 2.4 cm   LEFT ATRIUM             Index       RIGHT ATRIUM           Index  LA diam:        3.00 cm 1.59 cm/m  RA Area:     11.20 cm  LA Vol (A2C):   61.2 ml 32.52 ml/m RA Volume:   23.60 ml  12.54 ml/m  LA Vol (A4C):   55.7 ml 29.60 ml/m  LA Biplane Vol: 59.6 ml 31.67 ml/m   AORTIC VALVE                   PULMONIC VALVE  AV Area (Vmax):    3.01 cm    PV Vmax:       1.28 m/s  AV Area (Vmean):   2.96 cm    PV Peak grad:  6.6 mmHg  AV Area (VTI):     2.90 cm  AV Vmax:           128.00 cm/s  AV Vmean:          87.400 cm/s  AV VTI:            0.237 m  AV Peak Grad:      6.6 mmHg  AV Mean Grad:      4.0 mmHg  LVOT Vmax:         122.50 cm/s  LVOT Vmean:        82.350 cm/s  LVOT VTI:          0.219 m  LVOT/AV VTI ratio: 0.92     AORTA  Ao Root diam: 3.60 cm  Ao Asc diam:  3.50 cm   MITRAL VALVE   MV Area (PHT): 5.60 cm    SHUNTS  MV Decel Time: 136 msec    Systemic VTI:  0.22 m  MV E velocity: 87.60 cm/s  Systemic Diam: 2.00 cm  MV A velocity: 75.80 cm/s  MV E/A ratio:  1.16    CARDIAC CATH: 08/19/2020 Conclusions: Severe three vessel-coronary artery disease, including ulcerated 90% mid LAD stenosis, 90% mid LCx lesion, and sequential 75% mid RCA and proximal rPAV stenoses. Moderately elevated left ventricular filling pressure (LVEDP 25-30 mmHg) with echo earlier today showing LVEF 30-35%.   Recommendations: Cardiac surgery consultation for CABG. Restart IV heparin 2 hours after TR band removal. Aggressive secondary prevention of coronary artery disease; defer adding P2Y12 inhibitor pending cardiac surgery evaluation. Gentle diuresis; will start furosemide 20 mg IV daily with further titration as needed. Escalate goal-directed medical therapy for acute HFrEF, as blood pressure, heart rate, and renal function allow.       ASSESSMENT:    1. Late presentation acute anterior wall MI ([place]): 08/18/2020   2. Coronary artery disease involving native coronary artery of native heart without angina pectoris   3. Ischemic cardiomyopathy   4. S/P CABG x 3   5. Hyperlipidemia with target LDL less than 70   6. Tobacco abuse     PLAN:  Mr. Tyler Kim is a 65 year old gentleman who had a previous 2-3-pack per day tobacco habit for many years.  In early May he began to develop indigestion-like symptomatology.  He ultimately sought evaluation and was found to have a late presentation extensive anterior myocardial infarction with troponins greater than 27,000 and significant anterior and apical wall motion abnormalities with initial EF of 25 to 30%.  Cardiac catheterization demonstrated severe multivessel CAD.  Ultimately, he underwent successful coronary artery revascularization surgery with arterial conduit grafts including LIMA to LAD, RIMA to PDA, and right radial artery to OM1 vessel.   His intraoperative TEE echo showed an EF improved at 50 to 55%.  Presently, he has done exceptionally well.  His initial  postoperative course was complicated by atrial fibrillation with rapid ventricular response for which she required transient therapy with amiodarone.  Presently, he is feeling exceptionally well.  He is now on a regimen consisting of aspirin and Plavix, metoprolol succinate 25 mg daily.  He is on atorvastatin 80 mg and omega-3 fatty acid fish oil.  Presently he has returned to full activity.  He has been riding a bike, hiking, and cutting his grass without difficulty.  Unfortunately, he has smoked 2 to 3 cigarettes/day following his myocardial infarction.  CABG surgery and I discussed the importance of complete smoking cessation.  I am recommending follow-up with fasting laboratory with a comprehensive metabolic panel, CBC, TSH, fasting lipid studies, and I will also check a LP(a).  In 4 months, I am recommending he undergo a follow-up echo Doppler study for reassessment of LV systolic and diastolic function as well as wall motion abnormalities.  I will see him in March 2023 for reassessment and further recommendations at that time.   Medication Adjustments/Labs and Tests Ordered: Current medicines are reviewed at length with the patient today.  Concerns regarding medicines are outlined above.  Medication changes, Labs and Tests ordered today are listed in the Patient Instructions below. Patient Instructions  Medication Instructions:  No Changes In Medications at this time.  *If you need a refill on your cardiac medications before your next appointment, please call your pharmacy*  Lab Work: [facility] IS OPEN Monday-Friday from 8AM-4PM, NO APPOINTMENT IS NEEDED  If you have labs (blood work) drawn today and your tests are completely normal, you will receive your results only by: MyChart Message (if you have MyChart) OR A paper copy in the mail If  you have any lab test that is abnormal or we need to change your treatment, we will call you to review the results.  Testing/Procedures: Your physician has requested that you have an echocardiogram IN [place] OF 2023. Echocardiography is a painless test that uses sound waves to create images of your heart. It provides your doctor with information about the size and shape of your heart and how well your heart's chambers and valves are working. You may receive an ultrasound enhancing agent through an IV if needed to better visualize your heart during the echo.This procedure takes approximately one hour. There are no restrictions for this procedure. This will take place at the [address].   Follow-Up: At [facility], you and your health needs are our priority.  As part of our continuing mission to provide you with exceptional heart care, we have created designated Provider Care Teams.  These Care Teams include your primary Cardiologist (physician) and Advanced Practice Providers (APPs -  Physician Assistants and Nurse Practitioners) who all work together to provide you with the care you need, when you need it.  We recommend signing up for the patient portal called "MyChart".  Sign up information is provided on this After Visit Summary.  MyChart is used to connect with patients for Virtual Visits (Telemedicine).  Patients are able to view lab/test results, encounter notes, upcoming appointments, etc.  Non-urgent messages can be sent to your provider as well.   To learn more about what you can do with MyChart, go to [email or web address].    Your next appointment:   MARCH OF 2023  The format for your next appointment:   In Person  Provider:   Shelva Majestic, MD  We discussed this was not  done with good denies acute  Check   Signed, Shelva Majestic, MD  01/21/2021 11:44 AM    [facility] [address] [place], [place], [place]  [address] Phone:  [phone]

## 2021-01-20 ENCOUNTER — Other Ambulatory Visit: Payer: Self-pay | Admitting: Surgical

## 2021-01-21 ENCOUNTER — Encounter: Payer: Self-pay | Admitting: Cardiovascular Disease

## 2021-02-20 ENCOUNTER — Other Ambulatory Visit: Payer: Self-pay | Admitting: Surgical

## 2021-02-24 ENCOUNTER — Other Ambulatory Visit: Payer: Self-pay | Admitting: Cardiovascular Disease

## 2021-03-02 ENCOUNTER — Other Ambulatory Visit: Payer: Self-pay | Admitting: Surgical

## 2021-03-02 ENCOUNTER — Telehealth: Payer: Self-pay | Admitting: Cardiovascular Disease

## 2021-03-02 MED ORDER — CLOPIDOGREL BISULFATE 75 MG PO TABS: 75.0000 mg | ORAL_TABLET | Freq: Every day | ORAL | 5 refills | Status: DC

## 2021-03-02 NOTE — Telephone Encounter (Signed)
Medication sent electronically 

## 2021-03-02 NOTE — Telephone Encounter (Signed)
*  STAT* If patient is at the pharmacy, call can be transferred to refill team.   1. Which medications need to be refilled? (please list name of each medication and dose if known)  clopidogrel (PLAVIX) 75 MG tablet  2. Which pharmacy/location (including street and city if local pharmacy) is medication to be sent to? [id number] - [place], [place] - [address]  3. Do they need a 30 day or 90 day supply? 90 day

## 2021-05-11 ENCOUNTER — Ambulatory Visit (HOSPITAL_COMMUNITY): Payer: BC Managed Care – PPO | Attending: Cardiology

## 2021-05-11 ENCOUNTER — Other Ambulatory Visit: Payer: Self-pay

## 2021-05-11 DIAGNOSIS — I255 Ischemic cardiomyopathy: Secondary | ICD-10-CM

## 2021-05-11 DIAGNOSIS — I2510 Atherosclerotic heart disease of native coronary artery without angina pectoris: Secondary | ICD-10-CM | POA: Diagnosis present

## 2021-05-11 LAB — ECHOCARDIOGRAM COMPLETE
Area-P 1/2: 3.48 cm2
S' Lateral: 3 cm

## 2021-05-18 LAB — COMPREHENSIVE METABOLIC PANEL
ALT: 26 IU/L (ref 0–44)
AST: 22 IU/L (ref 0–40)
Albumin/Globulin Ratio: 2 (ref 1.2–2.2)
Albumin: 4.5 g/dL (ref 3.8–4.8)
Alkaline Phosphatase: 107 IU/L (ref 44–121)
BUN/Creatinine Ratio: 16 (ref 10–24)
BUN: 23 mg/dL (ref 8–27)
Bilirubin Total: 0.4 mg/dL (ref 0.0–1.2)
CO2: 23 mmol/L (ref 20–29)
Calcium: 9.3 mg/dL (ref 8.6–10.2)
Chloride: 106 mmol/L (ref 96–106)
Creatinine, Ser: 1.45 mg/dL — ABNORMAL HIGH (ref 0.76–1.27)
Globulin, Total: 2.3 g/dL (ref 1.5–4.5)
Glucose: 93 mg/dL (ref 70–99)
Potassium: 4.4 mmol/L (ref 3.5–5.2)
Sodium: 141 mmol/L (ref 134–144)
Total Protein: 6.8 g/dL (ref 6.0–8.5)
eGFR: 54 mL/min/{1.73_m2} — ABNORMAL LOW (ref >59)

## 2021-05-18 LAB — CBC
Hematocrit: 36.9 % — ABNORMAL LOW (ref 37.5–51.0)
Hemoglobin: 11.8 g/dL — ABNORMAL LOW (ref 13.0–17.7)
MCH: 30.6 pg (ref 26.6–33.0)
MCHC: 32 g/dL (ref 31.5–35.7)
MCV: 96 fL (ref 79–97)
Platelets: 271 10*3/uL (ref 150–450)
RBC: 3.85 x10E6/uL — ABNORMAL LOW (ref 4.14–5.80)
RDW: 13 % (ref 11.6–15.4)
WBC: 10.3 10*3/uL (ref 3.4–10.8)

## 2021-05-18 LAB — LIPID PANEL
Chol/HDL Ratio: 3.4 ratio (ref 0.0–5.0)
Cholesterol, Total: 118 mg/dL (ref 100–199)
HDL: 35 mg/dL — ABNORMAL LOW (ref >39)
LDL Chol Calc (NIH): 60 mg/dL (ref 0–99)
Triglycerides: 127 mg/dL (ref 0–149)
VLDL Cholesterol Cal: 23 mg/dL (ref 5–40)

## 2021-05-18 LAB — LIPOPROTEIN A (LPA): Lipoprotein (a): 76.5 nmol/L — ABNORMAL HIGH (ref <75.0)

## 2021-05-18 LAB — TSH: TSH: 1.52 u[IU]/mL (ref 0.450–4.500)

## 2021-06-23 ENCOUNTER — Encounter: Payer: Self-pay | Admitting: Cardiovascular Disease

## 2021-06-23 ENCOUNTER — Other Ambulatory Visit: Payer: Self-pay

## 2021-06-23 ENCOUNTER — Ambulatory Visit: Payer: BC Managed Care – PPO | Admitting: Cardiovascular Disease

## 2021-06-23 DIAGNOSIS — Z951 Presence of aortocoronary bypass graft: Secondary | ICD-10-CM | POA: Diagnosis not present

## 2021-06-23 DIAGNOSIS — E785 Hyperlipidemia, unspecified: Secondary | ICD-10-CM | POA: Diagnosis not present

## 2021-06-23 DIAGNOSIS — Z720 Tobacco use: Secondary | ICD-10-CM | POA: Diagnosis not present

## 2021-06-23 DIAGNOSIS — I2109 ST elevation (STEMI) myocardial infarction involving other coronary artery of anterior wall: Secondary | ICD-10-CM | POA: Diagnosis not present

## 2021-06-23 NOTE — Progress Notes (Signed)
? ?Cardiology Office Note   ? ?Date:  06/23/2021  ? ?ID:  Tyler Kim, DOB 1955/10/26, MRN [id number] ? ?PCP:  Lois Huxley, PA  ?Cardiologist:  Shelva Majestic, MD  ? ?5 month F/U office evaluation  ? ?History of Present Illness:  ?Tyler Kim is a 66 y.o. male who I had seen on 1 occasion during his hospitalization in May 2022.  I saw him for my initial office evaluation on January 19, 2021.  He presents for follow-up evaluation.   ? ?Mr. November developed new onset chest and possible digestive symptoms on Aug 17, 2020.  He initially presented to urgent care and was recommended to go to [facility] for further evaluation.  His ECG suggested a late presentation anterior myocardial infarction and his symptoms were intermittent episodes of "indigestion."  His ECG showed QS complex anteriorly with drip and troponins were greater than 27,000.  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  An echo Doppler study showed an EF in the 30 to 35% range with significant wall motion abnormality involving the mid anterior anteroseptal wall and apex.  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  He ultimately was evaluated by Dr. Julien Girt and underwent successful CABG revascularization surgery on Aug 24, 2020 with a LIMA to his distal LAD, RIMA to PDA, and right radial artery graft to OM1 of the circumflex vessel.  Post procedure he was placed on dual antiplatelet therapy and high intensity statin as well as low-dose metoprolol.  He developed postoperative atrial fibrillation with RVR and was initially treated with IV amiodarone.  Chest x-ray showed small left apical pneumothorax which stabilized. ? ?Following hospital discharge, he was seen by Almyra Deforest, [place] on September 22, 2020.  Prior to that evaluation, his amiodarone, isosorbide, and Lasix were discontinued during the visit with CT surgery.  His metoprolol has been reduced to 12.5 mg twice a  day and when seen by help this was consolidated to metoprolol succinate 25 mg daily.  He also added losartan 25 mg daily. ? ?Saw him for my initial office evaluation on January 19, 2021.  At that time he felt well and denied  any recurrent episodes of chest pain or shortness of breath.  Prior to surgery he had smoked 2 to 3 packs of cigarettes per day for many years and presently he states he is smoking 2 to 3 cigarettes/day.  He exercises regularly and rides a bike, hikes, cuts his grass and feels well without chest pain or exertional dyspnea.  He had not had recent laboratory I recommended he undergo pleat set of laboratory including assessment of LP(a).  LP(a) was borderline increased at 76.5 (normal less than 75).  Studies were excellent with total cholesterol 118 LDL cholesterol 60 triglycerides 127 although HDL was low at 35 creatinine was 1.45.  Hemoglobin 11.8/hematocrit 36.9. ? ?Since I last saw him, he remains entirely asymptomatic.  He is active.  He is planning a trip to the [place] in [place] in July.  I recommended that he undergo a 2D echo Doppler study which was done in February 2023.  This showed normal systolic function with EF at 55 to 60% with normal diastolic parameters.  He did have apical septal anterior akinesis, mild to moderate TR, and aortic sclerosis without stenosis.  There was borderline dilatation of his aortic root.  He has been on DAPT since his surgery.  Recently he has noticed increased  bleeding particularly when he nicks himself shaving and the bleeding is prolonged.  He still smokes 2 to 3 cigarettes/day.  He denies any change in exercise capacity or shortness of breath or chest pain.  He presents for evaluation. ? ? ?Past Surgical History:  ?Procedure Laterality Date  ? CORONARY ARTERY BYPASS GRAFT N/A 08/24/2020  ? Procedure: CORONARY ARTERY BYPASS GRAFTING (CABG) X3 USING BILATERAL MAMMARY ARTERIES AND RIGHT RADIAL ARTERY;  Surgeon: Wonda Olds, MD;   Location: [place];  Service: Open Heart Surgery;  Laterality: N/A;  ? LEFT HEART CATH AND CORONARY ANGIOGRAPHY N/A 08/19/2020  ? Procedure: LEFT HEART CATH AND CORONARY ANGIOGRAPHY;  Surgeon: Nelva Bush, MD;  Location: [facility];  Service: Cardiovascular;  Laterality: N/A;  ? TEE WITHOUT CARDIOVERSION N/A 08/24/2020  ? Procedure: TRANSESOPHAGEAL ECHOCARDIOGRAM (TEE);  Surgeon: Wonda Olds, MD;  Location: [place];  Service: Open Heart Surgery;  Laterality: N/A;  ? ? ?Current Medications: ?Outpatient Medications Prior to Visit  ?Medication Sig Dispense Refill  ? Ascorbic Acid (VITAMIN C) 1000 MG tablet Take 1,000 mg by mouth daily.    ? aspirin EC 81 MG EC tablet Take 1 tablet (81 mg total) by mouth daily. Swallow whole. 30 tablet 11  ? atorvastatin (LIPITOR) 80 MG tablet TAKE 1 TABLET BY MOUTH EVERY DAY 90 tablet 3  ? cholecalciferol (VITAMIN D3) 25 MCG (1000 UNIT) tablet Take 1,000 Units by mouth daily.    ? Coenzyme Q10 (COQ10) 200 MG CAPS Take by mouth.    ? metoprolol succinate (TOPROL-XL) 25 MG 24 hr tablet Take 1 tablet (25 mg total) by mouth daily. 90 tablet 3  ? nitroGLYCERIN (NITROSTAT) 0.4 MG SL tablet Place 1 tablet (0.4 mg total) under the tongue every 5 (five) minutes as needed for chest pain. 25 tablet 3  ? Omega-3 Fatty Acids (FISH OIL) 1000 MG CAPS Take 1 capsule by mouth daily.    ? vitamin B-12 (CYANOCOBALAMIN) 1000 MCG tablet Take 1,000 mcg by mouth daily.    ? vitamin E 180 MG (400 UNITS) capsule Take 400 Units by mouth daily.    ? Zinc 100 MG TABS Take 300 mg by mouth.    ? clopidogrel (PLAVIX) 75 MG tablet Take 1 tablet (75 mg total) by mouth daily. 30 tablet 5  ? acetaminophen (TYLENOL) 325 MG tablet Take 2 tablets (650 mg total) by mouth every 6 (six) hours as needed for mild pain.    ? ?No facility-administered medications prior to visit.  ?  ? ?Allergies:   Patient has no known allergies.  ? ?Social History  ? ?Socioeconomic History  ? Marital status: Married  ?  Spouse name:  Not on file  ? Number of children: Not on file  ? Years of education: Not on file  ? Highest education level: Not on file  ?Occupational History  ? Not on file  ?Tobacco Use  ? Smoking status: Every Day  ?  Packs/day: 2.00  ?  Types: Cigarettes  ? Smokeless tobacco: Never  ?Substance and Sexual Activity  ? Alcohol use: Not Currently  ? Drug use: Not Currently  ? Sexual activity: Not on file  ?Other Topics Concern  ? Not on file  ?Social History Narrative  ? Not on file  ? ?Social Determinants of Health  ? ?Financial Resource Strain: Not on file  ?Food Insecurity: Not on file  ?Transportation Needs: Not on file  ?Physical Activity: Not on file  ?Stress: Not on file  ?  Social Connections: Not on file  ?  ? ?Family History: He denies any family history for cardiovascular issues or diabetes mellitus ? ?ROS ?General: Negative; No fevers, chills, or night sweats;  ?HEENT: Negative; No changes in vision or hearing, sinus congestion, difficulty swallowing ?Pulmonary: Negative; No cough, wheezing, shortness of breath, hemoptysis ?Cardiovascular: Negative; No chest pain, presyncope, syncope, palpitations ?GI: Negative; No nausea, vomiting, diarrhea, or abdominal pain ?GU: Negative; No dysuria, hematuria, or difficulty voiding ?Musculoskeletal: Negative; no myalgias, joint pain, or weakness ?Hematologic/Oncology: Negative; no easy bruising, bleeding ?Endocrine: Negative; no heat/cold intolerance; no diabetes ?Neuro: Negative; no changes in balance, headaches ?Skin: Negative; No rashes or skin lesions ?Psychiatric: Negative; No behavioral problems, depression ?Sleep: Negative; No snoring, daytime sleepiness, hypersomnolence, bruxism, restless legs, hypnogognic hallucinations, no cataplexy ?Other comprehensive 14 point system review is negative. ? ? ?PHYSICAL EXAM:   ?VS:  BP 115/71   Pulse 63   Ht 5\' 10"  (1.778 m)   Wt 158 lb (71.7 kg)   SpO2 99%   BMI 22.67 kg/m?    ? ?Repeat blood pressure by me was 130/72 supine and  130/74 standing ? ?Wt Readings from Last 3 Encounters:  ?06/23/21 158 lb (71.7 kg)  ?01/19/21 159 lb 9.6 oz (72.4 kg)  ?09/22/20 157 lb (71.2 kg)  ?  ?General: Alert, oriented, no distress.  ?Skin: normal turgor,

## 2021-06-23 NOTE — Patient Instructions (Signed)
Medication Instructions:  ? ?-Stop taking clopidogrel (plavix). ? ?*If you need a refill on your cardiac medications before your next appointment, please call your pharmacy* ? ? ?Follow-Up: ?At [facility], you and your health needs are our priority.  As part of our continuing mission to provide you with exceptional heart care, we have created designated Provider Care Teams.  These Care Teams include your primary Cardiologist (physician) and Advanced Practice Providers (APPs -  Physician Assistants and Nurse Practitioners) who all work together to provide you with the care you need, when you need it. ? ?We recommend signing up for the patient portal called "MyChart".  Sign up information is provided on this After Visit Summary.  MyChart is used to connect with patients for Virtual Visits (Telemedicine).  Patients are able to view lab/test results, encounter notes, upcoming appointments, etc.  Non-urgent messages can be sent to your provider as well.   ?To learn more about what you can do with MyChart, go to [email or web address].   ? ?Your next appointment:   ?6 month(s) ? ?The format for your next appointment:   ?In Person ? ?Provider:   ?Nicki Guadalajara, MD  ?

## 2021-06-27 ENCOUNTER — Telehealth: Payer: Self-pay | Admitting: Cardiovascular Disease

## 2021-06-27 NOTE — Telephone Encounter (Signed)
?*  STAT* If patient is at the pharmacy, call can be transferred to refill team. ? ? ?1. Which medications need to be refilled? (please list name of each medication and dose if known) metoprolol succinate (TOPROL-XL) 25 MG 24 hr tablet ? ?2. Which pharmacy/location (including street and city if local pharmacy) is medication to be sent to? [id number] - [place], [place] - [address] ? ?3. Do they need a 30 day or 90 day supply? 90 day ? ?Patient is out of medication.   ?

## 2021-06-29 ENCOUNTER — Telehealth: Payer: Self-pay | Admitting: Cardiovascular Disease

## 2021-06-29 MED ORDER — METOPROLOL SUCCINATE ER 25 MG PO TB24: 25.0000 mg | ORAL_TABLET | Freq: Every day | ORAL | 0 refills | Status: DC

## 2021-06-29 MED ORDER — METOPROLOL SUCCINATE ER 25 MG PO TB24: 25.0000 mg | ORAL_TABLET | Freq: Every day | ORAL | 2 refills | Status: DC

## 2021-06-29 NOTE — Telephone Encounter (Signed)
Medication has been filled, patient made aware. Gave a verbal understanding ?

## 2021-06-29 NOTE — Telephone Encounter (Signed)
?*  STAT* If patient is at the pharmacy, call can be transferred to refill team. ? ? ?1. Which medications need to be refilled? (please list name of each medication and dose if known) metoprolol succinate (TOPROL-XL) 25 MG 24 hr tablet ? ?2. Which pharmacy/location (including street and city if local pharmacy) is medication to be sent to?[id number] [place], [place] [address] ? ?3. Do they need a 30 day or 90 day supply? 90 DS ? ?

## 2022-02-17 ENCOUNTER — Other Ambulatory Visit: Payer: Self-pay | Admitting: Cardiovascular Disease

## 2022-06-13 ENCOUNTER — Other Ambulatory Visit: Payer: Self-pay | Admitting: Surgical

## 2022-06-26 ENCOUNTER — Other Ambulatory Visit: Payer: Self-pay | Admitting: Cardiovascular Disease

## 2022-06-29 ENCOUNTER — Other Ambulatory Visit: Payer: Self-pay | Admitting: Cardiovascular Disease

## 2022-10-18 ENCOUNTER — Other Ambulatory Visit: Payer: Self-pay | Admitting: Cardiovascular Disease

## 2023-01-21 ENCOUNTER — Other Ambulatory Visit: Payer: Self-pay | Admitting: Cardiovascular Disease

## 2023-01-22 IMAGING — CR DG CHEST 2V
2 series · 2 of 2 positions shown · non-contrast
Comparison: August 27, 2020

CLINICAL DATA: Recent open heart surgery.

EXAM:
CHEST - 2 VIEW

[chest lat]
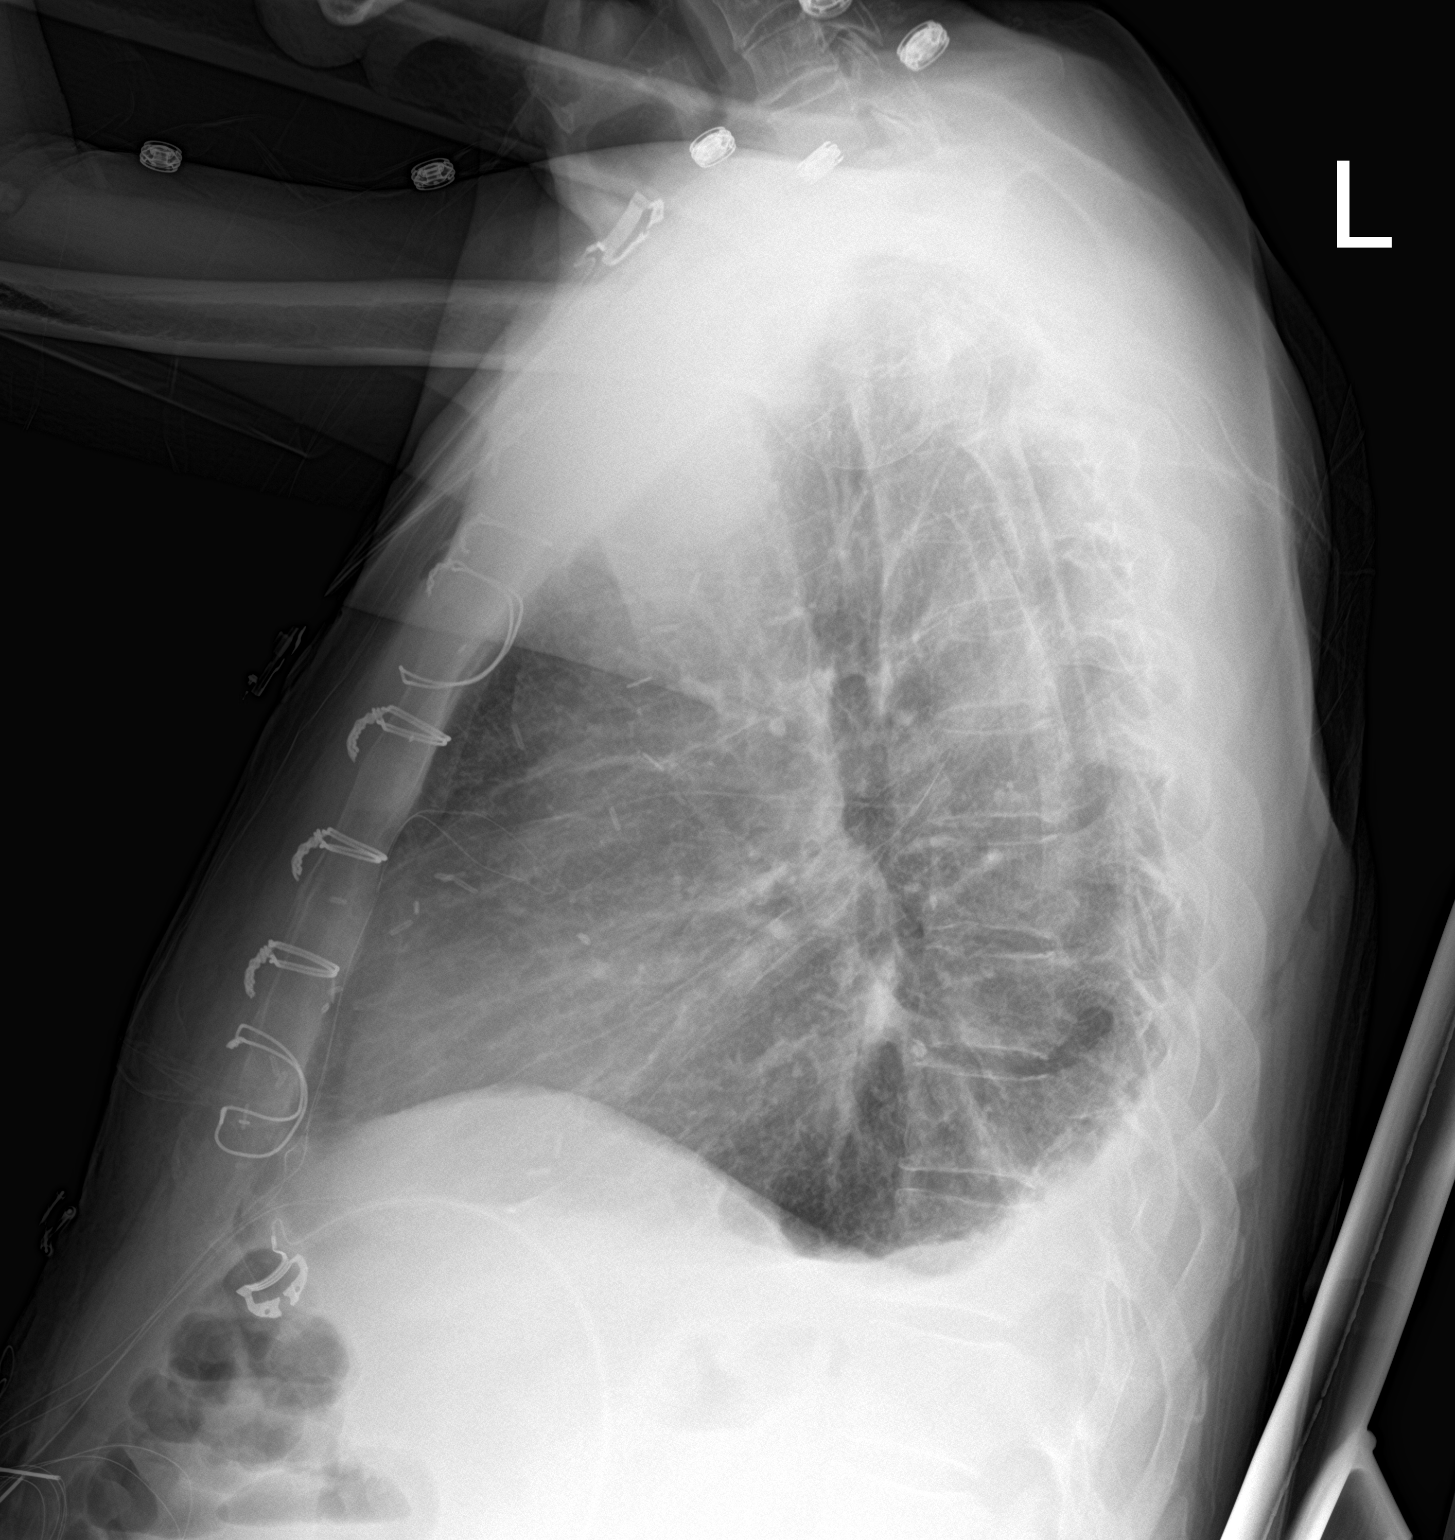

[chest ap]
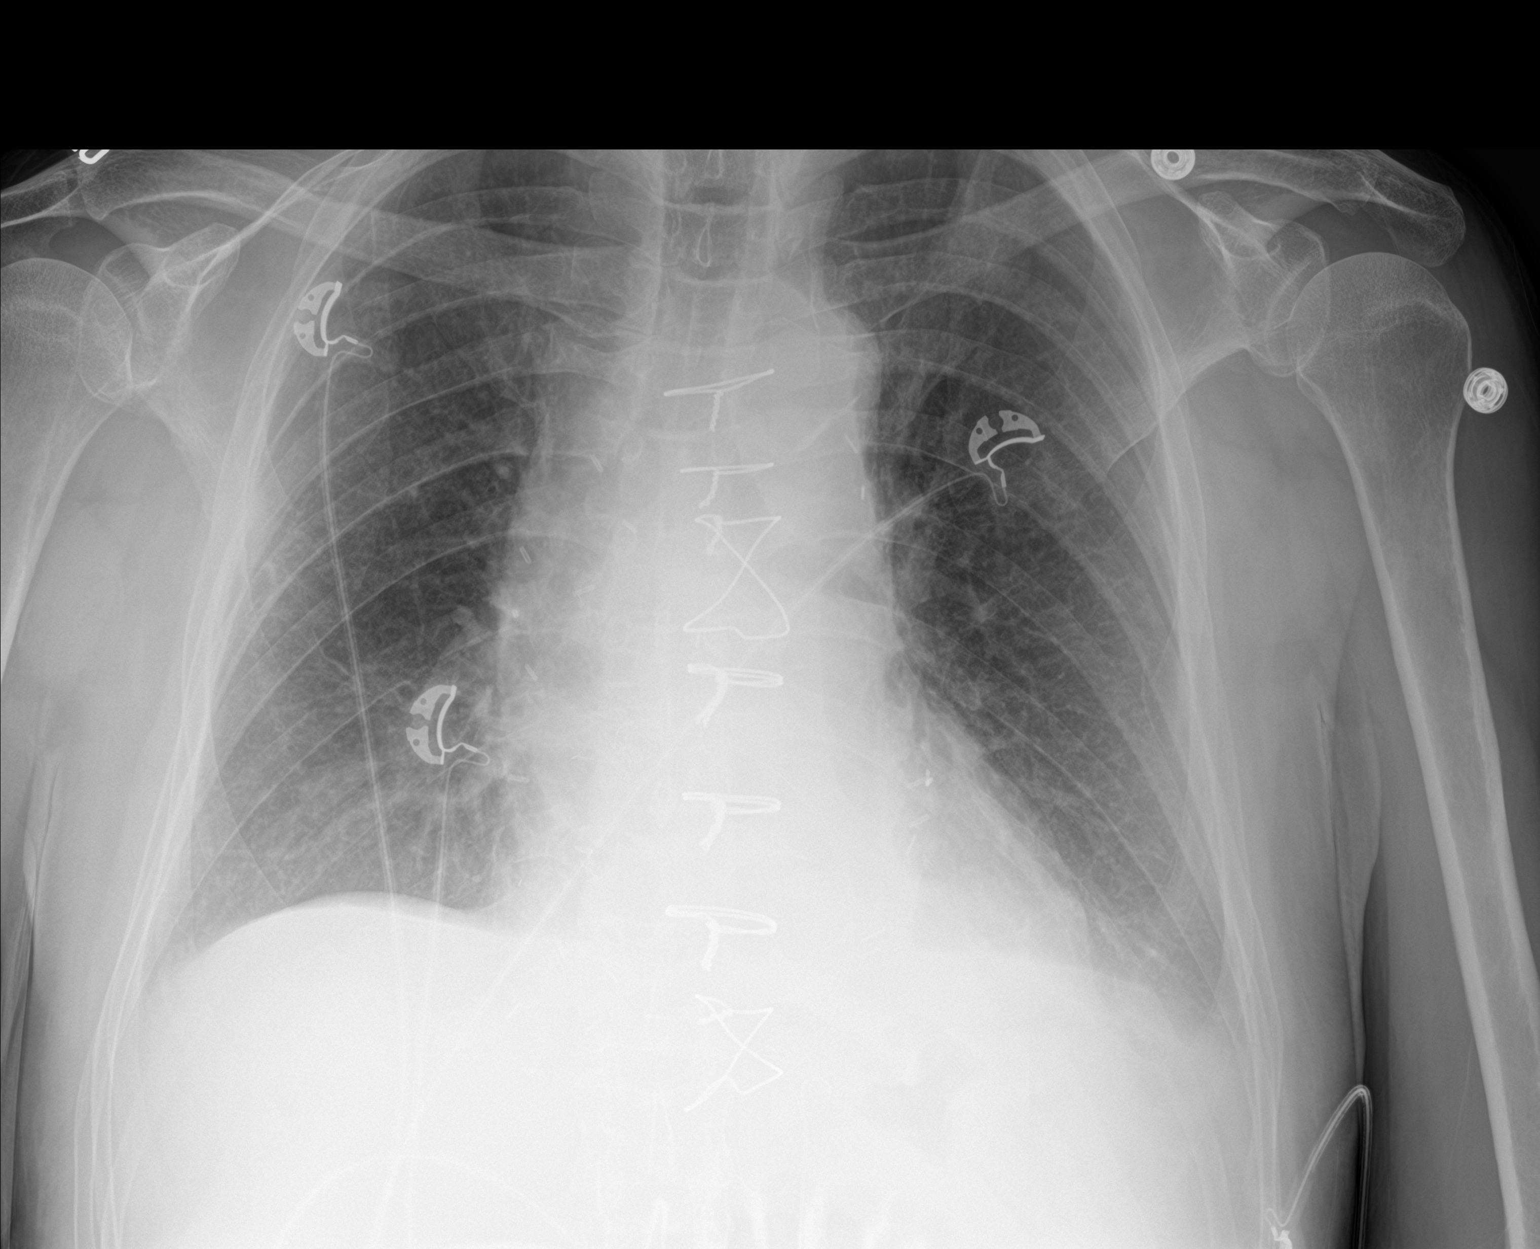

[2 of 2 positions shown; findings below may reference images not displayed]

FINDINGS: The small left apical pneumothorax is stable. No right-sided
pneumothorax. The cardiomediastinal silhouette is stable. The right
lung is clear. There is probably a small right pleural effusion. A
small left effusion with underlying atelectasis is identified. No
other abnormalities.
IMPRESSION: 1. Stable small left apical pneumothorax.
2. Small bilateral pleural effusions, left greater than right, with
underlying atelectasis.

## 2023-02-14 ENCOUNTER — Other Ambulatory Visit: Payer: Self-pay | Admitting: Cardiovascular Disease

## 2023-03-15 ENCOUNTER — Other Ambulatory Visit: Payer: Self-pay | Admitting: Cardiovascular Disease

## 2023-04-25 DIAGNOSIS — H353132 Nonexudative age-related macular degeneration, bilateral, intermediate dry stage: Secondary | ICD-10-CM | POA: Diagnosis not present

## 2023-04-25 DIAGNOSIS — H2513 Age-related nuclear cataract, bilateral: Secondary | ICD-10-CM | POA: Diagnosis not present

## 2023-04-25 DIAGNOSIS — H52203 Unspecified astigmatism, bilateral: Secondary | ICD-10-CM | POA: Diagnosis not present

## 2023-04-25 DIAGNOSIS — H524 Presbyopia: Secondary | ICD-10-CM | POA: Diagnosis not present

## 2023-05-02 DIAGNOSIS — R944 Abnormal results of kidney function studies: Secondary | ICD-10-CM | POA: Diagnosis not present

## 2023-05-02 DIAGNOSIS — Z0000 Encounter for general adult medical examination without abnormal findings: Secondary | ICD-10-CM | POA: Diagnosis not present

## 2023-05-02 DIAGNOSIS — Z1211 Encounter for screening for malignant neoplasm of colon: Secondary | ICD-10-CM | POA: Diagnosis not present

## 2023-05-02 DIAGNOSIS — I10 Essential (primary) hypertension: Secondary | ICD-10-CM | POA: Diagnosis not present

## 2023-05-02 DIAGNOSIS — E785 Hyperlipidemia, unspecified: Secondary | ICD-10-CM | POA: Diagnosis not present

## 2023-05-02 DIAGNOSIS — R030 Elevated blood-pressure reading, without diagnosis of hypertension: Secondary | ICD-10-CM | POA: Diagnosis not present

## 2023-05-02 DIAGNOSIS — Z136 Encounter for screening for cardiovascular disorders: Secondary | ICD-10-CM | POA: Diagnosis not present

## 2023-05-02 DIAGNOSIS — I2510 Atherosclerotic heart disease of native coronary artery without angina pectoris: Secondary | ICD-10-CM | POA: Diagnosis not present

## 2023-05-02 DIAGNOSIS — Z720 Tobacco use: Secondary | ICD-10-CM | POA: Diagnosis not present

## 2023-06-05 DIAGNOSIS — I10 Essential (primary) hypertension: Secondary | ICD-10-CM | POA: Diagnosis not present

## 2023-06-20 ENCOUNTER — Other Ambulatory Visit: Payer: Self-pay | Admitting: Cardiovascular Disease

## 2023-08-03 DIAGNOSIS — E785 Hyperlipidemia, unspecified: Secondary | ICD-10-CM | POA: Diagnosis not present

## 2023-08-14 ENCOUNTER — Encounter: Payer: Self-pay | Admitting: Cardiovascular Disease

## 2023-08-14 ENCOUNTER — Ambulatory Visit: Payer: BC Managed Care – PPO | Attending: Cardiovascular Disease | Admitting: Cardiovascular Disease

## 2023-08-14 DIAGNOSIS — Z951 Presence of aortocoronary bypass graft: Secondary | ICD-10-CM

## 2023-08-14 DIAGNOSIS — I2510 Atherosclerotic heart disease of native coronary artery without angina pectoris: Secondary | ICD-10-CM | POA: Diagnosis not present

## 2023-08-14 DIAGNOSIS — E7841 Elevated Lipoprotein(a): Secondary | ICD-10-CM

## 2023-08-14 DIAGNOSIS — E785 Hyperlipidemia, unspecified: Secondary | ICD-10-CM

## 2023-08-14 DIAGNOSIS — I2109 ST elevation (STEMI) myocardial infarction involving other coronary artery of anterior wall: Secondary | ICD-10-CM | POA: Diagnosis not present

## 2023-08-14 DIAGNOSIS — Z720 Tobacco use: Secondary | ICD-10-CM

## 2023-08-14 NOTE — Progress Notes (Signed)
 Cardiology Office Note    Date:  08/19/2023   ID:  Tyler Kim, DOB Nov 15, 1955, MRN [id number]  PCP:  Sinda Duel, PA  Cardiologist:  Magnus Schuller, MD   26 month F/U office evaluation   History of Present Illness:  Tyler Kim is a 68 y.o. male who I had seen on 1 occasion during his hospitalization in May 2022.  I saw him for my initial office evaluation on January 19, 2021 and last saw him on June 23, 2021.Tyler Kim  He presents for a 26 month follow-up evaluation.    Tyler Kim developed new onset chest and possible digestive symptoms on Aug 17, 2020.  He initially presented to urgent care and was recommended to go to [facility] for further evaluation.  His ECG suggested a late presentation anterior myocardial infarction and his symptoms were intermittent episodes of "indigestion."  His ECG showed QS complex anteriorly with drip and troponins were greater than 27,000.  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  An echo Doppler study showed an EF in the 30 to 35% range with significant wall motion abnormality involving the mid anterior anteroseptal wall and apex.  The hope was for stunned myocardium rather than irreversible scar.  He ultimately was evaluated by Dr. Avis Boehringer and underwent successful CABG revascularization surgery on Aug 24, 2020 with a LIMA to his distal LAD, RIMA to PDA, and right radial artery graft to OM1 of the circumflex vessel.  Post procedure he was placed on dual antiplatelet therapy and high intensity statin as well as low-dose metoprolol .  He developed postoperative atrial fibrillation with RVR and was initially treated with IV amiodarone .  Chest x-ray showed small left apical pneumothorax which stabilized.  Following hospital discharge, he was seen by Ervin Heath, PA on September 22, 2020.  Prior to that evaluation, his amiodarone , isosorbide , and Lasix  were discontinued during the visit with CT surgery.   His metoprolol  has been reduced to 12.5 mg twice a day and when seen by help this was consolidated to metoprolol  succinate 25 mg daily.  He also added losartan  25 mg daily.  I saw him for my initial office evaluation on January 19, 2021.  At that time he felt well and denied  any recurrent episodes of chest pain or shortness of breath.  Prior to surgery he had smoked 2 to 3 packs of cigarettes per day for many years and presently he states he is smoking 2 to 3 cigarettes/day.  He exercises regularly and rides a bike, hikes, cuts his grass and feels well without chest pain or exertional dyspnea.  He had not had recent laboratory I recommended he undergo pleat set of laboratory including assessment of LP(a).  LP(a) was borderline increased at 76.5 (normal less than 75).  Studies were excellent with total cholesterol 118 LDL cholesterol 60 triglycerides 127 although HDL was low at 35 creatinine was 1.45.  Hemoglobin 11.8/hematocrit 36.9.  I last saw him on June 23, 2021 at which time he remained entirely asymptomatic.  He is active.  He is planning a trip to [place] in [facility] in July.  I recommended that he undergo a 2D echo Doppler study which was done in February 2023.  This showed normal systolic function with EF at 55 to 60% with normal diastolic parameters.  He did have apical septal anterior akinesis, mild to moderate TR, and aortic sclerosis without stenosis.  There  was borderline dilatation of his aortic root.  He has been on DAPT since his surgery.  Recently he has noticed increased bleeding particularly when he nicks himself shaving and the bleeding is prolonged.  He still smokes 2 to 3 cigarettes/day.  He denies any change in exercise capacity or shortness of breath or chest pain.  During that evaluation I suggested he discontinue clopidogrel  and continue aspirin  daily.  I again stressed the importance of complete smoking cessation.  Since I last saw him, he remains asymptomatic.   He retired at age 49.  Blood pressure at home typically runs between 125 and 130 systolic.  Lipid studies in January 2025 showed total cholesterol at 152, HDL 41, LDL 89 and triglycerides 124.  Subsequently his atorvastatin  dose was increased to 80 mg.  He tells me he had lab work 2 weeks ago with Barnet Lias, PA at [place].  He is now on atorvastatin  80 mg, Zetia 10 mg and over-the-counter fish oil.  He takes losartan  100 mg daily and metoprolol  succinate 25 mg for blood pressure control.  He takes baby aspirin .  He presents for evaluation.  Past Surgical History:  Procedure Laterality Date   CORONARY ARTERY BYPASS GRAFT N/A 08/24/2020   Procedure: CORONARY ARTERY BYPASS GRAFTING (CABG) X3 USING BILATERAL MAMMARY ARTERIES AND RIGHT RADIAL ARTERY;  Surgeon: Rudine Cos, MD;  Location: [facility] OR;  Service: Open Heart Surgery;  Laterality: N/A;   LEFT HEART CATH AND CORONARY ANGIOGRAPHY N/A 08/19/2020   Procedure: LEFT HEART CATH AND CORONARY ANGIOGRAPHY;  Surgeon: Sammy Crisp, MD;  Location: [facility] INVASIVE CV LAB;  Service: Cardiovascular;  Laterality: N/A;   TEE WITHOUT CARDIOVERSION N/A 08/24/2020   Procedure: TRANSESOPHAGEAL ECHOCARDIOGRAM (TEE);  Surgeon: Rudine Cos, MD;  Location: [facility] OR;  Service: Open Heart Surgery;  Laterality: N/A;    Current Medications: Outpatient Medications Prior to Visit  Medication Sig Dispense Refill   Ascorbic Acid (VITAMIN C) 1000 MG tablet Take 1,000 mg by mouth daily.     aspirin  EC 81 MG EC tablet Take 1 tablet (81 mg total) by mouth daily. Swallow whole. 30 tablet 11   atorvastatin  (LIPITOR ) 80 MG tablet TAKE 1 TABLET BY MOUTH EVERY DAY 90 tablet 1   cholecalciferol (VITAMIN D3) 25 MCG (1000 UNIT) tablet Take 1,000 Units by mouth daily.     Coenzyme Q10 (COQ10) 200 MG CAPS Take by mouth.     ezetimibe (ZETIA) 10 MG tablet Take 10 mg by mouth daily.     losartan  (COZAAR ) 100 MG tablet Take 100 mg by mouth daily.     metoprolol  succinate (TOPROL -XL) 25  MG 24 hr tablet TAKE 1 TABLET (25 MG TOTAL) BY MOUTH DAILY. 30 tablet 1   nitroGLYCERIN  (NITROSTAT ) 0.4 MG SL tablet Place 1 tablet (0.4 mg total) under the tongue every 5 (five) minutes as needed for chest pain. 25 tablet 3   Omega-3 Fatty Acids (FISH OIL) 1000 MG CAPS Take 1 capsule by mouth daily.     vitamin B-12 (CYANOCOBALAMIN) 1000 MCG tablet Take 1,000 mcg by mouth daily.     vitamin E 180 MG (400 UNITS) capsule Take 400 Units by mouth daily.     Zinc 100 MG TABS Take 300 mg by mouth.     No facility-administered medications prior to visit.     Allergies:   Patient has no known allergies.   Social History   Socioeconomic History   Marital status: Married    Spouse name: Not on  file   Number of children: Not on file   Years of education: Not on file   Highest education level: Not on file  Occupational History   Not on file  Tobacco Use   Smoking status: Every Day    Current packs/day: 2.00    Types: Cigarettes   Smokeless tobacco: Never   Tobacco comments:    2-3 cigarettes a day  Substance and Sexual Activity   Alcohol use: Not Currently   Drug use: Not Currently   Sexual activity: Not on file  Other Topics Concern   Not on file  Social History Narrative   Not on file   Social Drivers of Health   Financial Resource Strain: Not on file  Food Insecurity: Not on file  Transportation Needs: Not on file  Physical Activity: Not on file  Stress: Not on file  Social Connections: Not on file     Family History: He denies any family history for cardiovascular issues or diabetes mellitus  ROS General: Negative; No fevers, chills, or night sweats;  HEENT: Negative; No changes in vision or hearing, sinus congestion, difficulty swallowing Pulmonary: Negative; No cough, wheezing, shortness of breath, hemoptysis Cardiovascular: Negative; No chest pain, presyncope, syncope, palpitations GI: Negative; No nausea, vomiting, diarrhea, or abdominal pain GU: Negative; No  dysuria, hematuria, or difficulty voiding Musculoskeletal: Negative; no myalgias, joint pain, or weakness Hematologic/Oncology: Negative; no easy bruising, bleeding Endocrine: Negative; no heat/cold intolerance; no diabetes Neuro: Negative; no changes in balance, headaches Skin: Negative; No rashes or skin lesions Psychiatric: Negative; No behavioral problems, depression Sleep: Negative; No snoring, daytime sleepiness, hypersomnolence, bruxism, restless legs, hypnogognic hallucinations, no cataplexy Other comprehensive 14 point system review is negative.   PHYSICAL EXAM:   VS:  BP (!) 144/82   Pulse (!) 52   Ht 5\' 10"  (1.778 m)   Wt 156 lb 12.8 oz (71.1 kg)   SpO2 98%   BMI 22.50 kg/m     Repeat blood pressure by me was 140/76.  Blood pressure at home typically 125-130/60-70 range  Wt Readings from Last 3 Encounters:  08/14/23 156 lb 12.8 oz (71.1 kg)  06/23/21 158 lb (71.7 kg)  01/19/21 159 lb 9.6 oz (72.4 kg)    General: Alert, oriented, no distress.  Skin: normal turgor, no rashes, warm and dry HEENT: Normocephalic, atraumatic. Pupils equal round and reactive to light; sclera anicteric; extraocular muscles intact;  Nose without nasal septal hypertrophy Mouth/Parynx benign; Mallinpatti scale Neck: No JVD, no carotid bruits; normal carotid upstroke Lungs: clear to ausculatation and percussion; no wheezing or rales Chest wall: without tenderness to palpitation Heart: PMI not displaced, RRR, s1 s2 normal, 1/6 systolic murmur, no diastolic murmur, no rubs, gallops, thrills, or heaves Abdomen: soft, nontender; no hepatosplenomehaly, BS+; abdominal aorta nontender and not dilated by palpation. Back: no CVA tenderness Pulses 2+ Musculoskeletal: full range of motion, normal strength, no joint deformities Extremities: no clubbing cyanosis or edema, Homan's sign negative  Neurologic: grossly nonfocal; Cranial nerves grossly wnl Psychologic: Normal mood and affect    Studies/Labs  Reviewed:   EKG Interpretation Date/Time:  Tuesday Aug 14 2023 10:42:06 EDT Ventricular Rate:  52 PR Interval:  170 QRS Duration:  78 QT Interval:  422 QTC Calculation: 392 R Axis:   49  Text Interpretation: Sinus bradycardia Low voltage QRS ST & T wave abnormality, consider anterolateral ischemia When compared with ECG of 26-Aug-2020 02:58, Sinus rhythm has replaced Electronic ventricular pacemaker Vent. rate has decreased BY  83 BPM  Confirmed by Magnus Schuller ([id number]) on 08/14/2023 10:55:13 AM    June 23, 2021 ECG (independently read by me):  NSR at 63; QS V1-3, aVL, STT changes V3-6, I  January 19, 2021 ECG (independently read by me):  Sinus bradycardia at 57, QS v1-3, t v1-6, I Avl  Recent Labs:    Latest Ref Rng & Units 05/17/2021    9:22 AM 11/02/2020    9:23 AM 10/07/2020    8:56 AM  BMP  Glucose 70 - 99 mg/dL 93  94  98   BUN 8 - 27 mg/dL 23  30  27    Creatinine 0.76 - 1.27 mg/dL 1.32  4.40  1.02   BUN/Creat Ratio 10 - 24 16  20  19    Sodium 134 - 144 mmol/L 141  141  137   Potassium 3.5 - 5.2 mmol/L 4.4  4.9  5.6   Chloride 96 - 106 mmol/L 106  104  103   CO2 20 - 29 mmol/L 23  22  19    Calcium  8.6 - 10.2 mg/dL 9.3  9.8  9.9         Latest Ref Rng & Units 05/17/2021    9:22 AM 10/07/2020    8:56 AM 08/25/2020    5:55 AM  Hepatic Function  Total Protein 6.0 - 8.5 g/dL 6.8  7.5  5.0   Albumin  3.8 - 4.8 g/dL 4.5  5.1  3.1   AST 0 - 40 IU/L 22  26  39   ALT 0 - 44 IU/L 26  30  26    Alk Phosphatase 44 - 121 IU/L 107  115  40   Total Bilirubin 0.0 - 1.2 mg/dL 0.4  0.3  0.7        Latest Ref Rng & Units 05/17/2021    9:22 AM 10/07/2020    8:56 AM 08/30/2020    2:03 AM  CBC  WBC 3.4 - 10.8 x10E3/uL 10.3  9.5  12.8   Hemoglobin 13.0 - 17.7 g/dL 72.5  36.6  8.1   Hematocrit 37.5 - 51.0 % 36.9  41.1  25.3   Platelets 150 - 450 x10E3/uL 271  279  300    Lab Results  Component Value Date   MCV 96 05/17/2021   MCV 91 10/07/2020   MCV 92.7 08/30/2020   Lab Results   Component Value Date   TSH 1.520 05/17/2021   Lab Results  Component Value Date   HGBA1C 5.7 (H) 08/18/2020     BNP    Component Value Date/Time   BNP 279.1 (H) 08/21/2020 0900    ProBNP No results found for: "PROBNP"   Lipid Panel     Component Value Date/Time   CHOL 118 05/17/2021 0922   TRIG 127 05/17/2021 0922   HDL 35 (L) 05/17/2021 0922   CHOLHDL 3.4 05/17/2021 0922   CHOLHDL 5.6 08/19/2020 0415   VLDL 23 08/19/2020 0415   LDLCALC 60 05/17/2021 0922   LABVLDL 23 05/17/2021 0922     RADIOLOGY: No results found.   Additional studies/ records that were reviewed today include:    ECHO:  Aug 19, 2020 IMPRESSIONS   1. Definity  contrast was used to image the LV . The apex and mid- apical  anterior septal walls are akinetic. There is swirling of contrast but no  evidence of apical thrombi. . Left ventricular ejection fraction, by  estimation, is 30 to 35%. The left  ventricle has moderately decreased function. The left ventricle  demonstrates regional wall motion abnormalities (see scoring  diagram/findings for description). Left ventricular diastolic parameters  are consistent with Grade II diastolic dysfunction  (pseudonormalization). There is severe akinesis of the left ventricular,  mid-apical anteroseptal wall and apical segment.   2. Right ventricular systolic function is normal. The right ventricular  size is normal.   3. The mitral valve is grossly normal. Trivial mitral valve  regurgitation.   4. The aortic valve is grossly normal. Aortic valve regurgitation is not  visualized.   FINDINGS   Left Ventricle: Definity  contrast was used to image the LV . The apex and  mid- apical anterior septal walls are akinetic. There is swirling of  contrast but no evidence of apical thrombi. Left ventricular ejection  fraction, by estimation, is 30 to 35%.  The left ventricle has moderately decreased function. The left ventricle  demonstrates regional wall  motion abnormalities. Severe akinesis of the  left ventricular, mid-apical anteroseptal wall and apical segment. The  left ventricular internal cavity size  was normal in size. There is no left ventricular hypertrophy. Left  ventricular diastolic parameters are consistent with Grade II diastolic  dysfunction (pseudonormalization).   Right Ventricle: The right ventricular size is normal. No increase in  right ventricular wall thickness. Right ventricular systolic function is  normal.   Left Atrium: Left atrial size was normal in size.   Right Atrium: Right atrial size was normal in size.   Pericardium: There is no evidence of pericardial effusion.   Mitral Valve: The mitral valve is grossly normal. Trivial mitral valve  regurgitation.   Tricuspid Valve: The tricuspid valve is normal in structure. Tricuspid  valve regurgitation is trivial.   Aortic Valve: The aortic valve is grossly normal. Aortic valve  regurgitation is not visualized. Aortic valve mean gradient measures 4.0  mmHg. Aortic valve peak gradient measures 6.6 mmHg. Aortic valve area, by  VTI measures 2.90 cm.   Pulmonic Valve: The pulmonic valve was normal in structure. Pulmonic valve  regurgitation is not visualized.   Aorta: The aortic root and ascending aorta are structurally normal, with  no evidence of dilitation.   IAS/Shunts: The atrial septum is grossly normal.      LEFT VENTRICLE  PLAX 2D  LVIDd:         4.10 cm  Diastology  LVIDs:         2.70 cm  LV e' medial:    7.40 cm/s  LV PW:         1.40 cm  LV E/e' medial:  11.8  LV IVS:        1.50 cm  LV e' lateral:   9.68 cm/s  LVOT diam:     2.00 cm  LV E/e' lateral: 9.0  LV SV:         69  LV SV Index:   36  LVOT Area:     3.14 cm      RIGHT VENTRICLE             IVC  RV Basal diam:  3.00 cm     IVC diam: 1.60 cm  RV S prime:     11.70 cm/s  TAPSE (M-mode): 2.4 cm   LEFT ATRIUM             Index       RIGHT ATRIUM           Index  LA diam:         3.00 cm 1.59  cm/m  RA Area:     11.20 cm  LA Vol (A2C):   61.2 ml 32.52 ml/m RA Volume:   23.60 ml  12.54 ml/m  LA Vol (A4C):   55.7 ml 29.60 ml/m  LA Biplane Vol: 59.6 ml 31.67 ml/m   AORTIC VALVE                   PULMONIC VALVE  AV Area (Vmax):    3.01 cm    PV Vmax:       1.28 m/s  AV Area (Vmean):   2.96 cm    PV Peak grad:  6.6 mmHg  AV Area (VTI):     2.90 cm  AV Vmax:           128.00 cm/s  AV Vmean:          87.400 cm/s  AV VTI:            0.237 m  AV Peak Grad:      6.6 mmHg  AV Mean Grad:      4.0 mmHg  LVOT Vmax:         122.50 cm/s  LVOT Vmean:        82.350 cm/s  LVOT VTI:          0.219 m  LVOT/AV VTI ratio: 0.92     AORTA  Ao Root diam: 3.60 cm  Ao Asc diam:  3.50 cm   MITRAL VALVE  MV Area (PHT): 5.60 cm    SHUNTS  MV Decel Time: 136 msec    Systemic VTI:  0.22 m  MV E velocity: 87.60 cm/s  Systemic Diam: 2.00 cm  MV A velocity: 75.80 cm/s  MV E/A ratio:  1.16    CARDIAC CATH: 08/19/2020 Conclusions: Severe three vessel-coronary artery disease, including ulcerated 90% mid LAD stenosis, 90% mid LCx lesion, and sequential 75% mid RCA and proximal rPAV stenoses. Moderately elevated left ventricular filling pressure (LVEDP 25-30 mmHg) with echo earlier today showing LVEF 30-35%.   Recommendations: Cardiac surgery consultation for CABG. Restart IV heparin  2 hours after TR band removal. Aggressive secondary prevention of coronary artery disease; defer adding P2Y12 inhibitor pending cardiac surgery evaluation. Gentle diuresis; will start furosemide  20 mg IV daily with further titration as needed. Escalate goal-directed medical therapy for acute HFrEF, as blood pressure, heart rate, and renal function allow.       ECHO: 05/11/2021  1. Left ventricular ejection fraction, by estimation, is 55 to 60%. The  left ventricle has normal function. The left ventricle demonstrates  regional wall motion abnormalities (see scoring diagram/findings for   description). There is moderate left  ventricular hypertrophy of the basal-septal segment. Left ventricular  diastolic parameters were normal. There is akinesis of the left  ventricular, apical septal wall and anterior wall. There is akinesis of  the left ventricular, apical segment.   2. Right ventricular systolic function is mildly reduced. The right  ventricular size is normal.   3. The mitral valve is normal in structure. Trivial mitral valve  regurgitation. No evidence of mitral stenosis.   4. Tricuspid valve regurgitation is mild to moderate.   5. The aortic valve is tricuspid. Aortic valve regurgitation is not  visualized. Aortic valve sclerosis is present, with no evidence of aortic  valve stenosis.   6. Aortic dilatation noted. There is borderline dilatation of the aortic  root, measuring 37 mm.   ASSESSMENT:    1. Acute anterior wall MI [facility]): May  2022   2. 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 Hyperlipidemia LDL goal <50   4. 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 Tobacco abuse     PLAN:  Tyler Kim is a 68 year old gentleman who had a previous 2-3-pack per day tobacco habit for many years.  In early May 2022 he began to develop indigestion-like symptomatology.  He ultimately sought evaluation and was found to have a late presentation extensive anterior myocardial infarction with troponins greater than 27,000 and significant anterior and apical wall motion abnormalities with initial EF of 25 to 30%.  Cardiac catheterization demonstrated severe multivessel CAD.  Ultimately, he underwent successful coronary artery revascularization surgery with arterial conduit grafts including LIMA to LAD, RIMA to PDA, and right radial artery to OM1 vessel.  His intraoperative TEE echo showed an EF improved at 50 to 55%.  Presently, he has done exceptionally well.  His initial postoperative course was complicated by atrial fibrillation with rapid ventricular response for which she required  transient therapy with amiodarone .  When I saw him for my initial evaluation with me in October 2022 remained asymptomatic and denied any exertional shortness of breath.  He underwent complete set of laboratory.  LP(a) was borderline increased at 76.5.  On high potency statin therapy LDL cholesterol was 60.  Follow-up echo in February 2023 showed EF at 55 to 60% with apical septal and distal anterior wall motion abnormality.  He subsequently had bleeding issues leading to discontinuance of clopidogrel .  Presently, he is asymptomatic on his current regimen of losartan  100 mg and metoprolol  succinate 25 mg daily.  His dose of atorvastatin  was increased earlier this year to 80 mg and he is on Zetia 10 mg in addition to fish oil as well.  Apparently he had repeat laboratory 2 weeks ago at [place].  Target LDL should be less than 55 and preferably lower with his borderline LP(a) elevation.  He is still smoking approximately 3 cigarettes a day.  I stressed the importance of complete discontinuance of tobacco.  I discussed with him my upcoming retirement.  I will transition him to the care of Dr. Randene Bustard for cardiology follow-up.   Medication Adjustments/Labs and Tests Ordered: Current medicines are reviewed at length with the patient today.  Concerns regarding medicines are outlined above.  Medication changes, Labs and Tests ordered today are listed in the Patient Instructions below. Patient Instructions  Medication Instructions:  NO CHANGES *If you need a refill on your cardiac medications before your next appointment, please call your pharmacy*  Lab Work: NO LABS If you have labs (blood work) drawn today and your tests are completely normal, you will receive your results only by: MyChart Message (if you have MyChart) OR A paper copy in the mail If you have any lab test that is abnormal or we need to change your treatment, we will call you to review the results.  Testing/Procedures: NO  TESTING  Follow-Up: At [facility], you and your health needs are our priority.  As part of our continuing mission to provide you with exceptional heart care, our providers are all part of one team.  This team includes your primary Cardiologist (physician) and Advanced Practice Providers or APPs (Physician Assistants and Nurse Practitioners) who all work together to provide you with the care you need, when you need it.  Your next appointment:   1 year(s)  Provider:   Randene Bustard, MD   Signed, Magnus Schuller, MD  08/19/2023 10:57 AM    [facility] [address], [address], [place], [place]  [address] Phone: [phone]

## 2023-08-14 NOTE — Patient Instructions (Signed)
 Medication Instructions:  NO CHANGES *If you need a refill on your cardiac medications before your next appointment, please call your pharmacy*  Lab Work: NO LABS If you have labs (blood work) drawn today and your tests are completely normal, you will receive your results only by: MyChart Message (if you have MyChart) OR A paper copy in the mail If you have any lab test that is abnormal or we need to change your treatment, we will call you to review the results.  Testing/Procedures: NO TESTING  Follow-Up: At [facility], you and your health needs are our priority.  As part of our continuing mission to provide you with exceptional heart care, our providers are all part of one team.  This team includes your primary Cardiologist (physician) and Advanced Practice Providers or APPs (Physician Assistants and Nurse Practitioners) who all work together to provide you with the care you need, when you need it.  Your next appointment:   1 year(s)  Provider:   Randene Bustard, MD

## 2023-08-18 ENCOUNTER — Other Ambulatory Visit: Payer: Self-pay | Admitting: Cardiovascular Disease

## 2023-08-19 ENCOUNTER — Encounter: Payer: Self-pay | Admitting: Cardiovascular Disease

## 2023-09-10 ENCOUNTER — Other Ambulatory Visit: Payer: Self-pay | Admitting: Cardiovascular Disease
